# Patient Record
Sex: Female | Born: 1975 | Race: Black or African American | Hispanic: No | Marital: Single | Smoking: Former smoker
Health system: Southern US, Community
[De-identification: ages and names within clinical notes are randomized; demographics above are authoritative.]

## PROBLEM LIST (undated history)

## (undated) DIAGNOSIS — G2581 Restless legs syndrome: Secondary | ICD-10-CM

## (undated) DIAGNOSIS — E042 Nontoxic multinodular goiter: Secondary | ICD-10-CM

## (undated) DIAGNOSIS — G35 Multiple sclerosis: Secondary | ICD-10-CM

## (undated) DIAGNOSIS — H409 Unspecified glaucoma: Secondary | ICD-10-CM

## (undated) DIAGNOSIS — G932 Benign intracranial hypertension: Secondary | ICD-10-CM

## (undated) HISTORY — DX: Benign intracranial hypertension: G93.2

## (undated) HISTORY — DX: Restless legs syndrome: G25.81

---

## 2017-11-21 DIAGNOSIS — A609 Anogenital herpesviral infection, unspecified: Secondary | ICD-10-CM | POA: Diagnosis not present

## 2017-11-21 DIAGNOSIS — R300 Dysuria: Secondary | ICD-10-CM | POA: Diagnosis not present

## 2017-12-09 DIAGNOSIS — L219 Seborrheic dermatitis, unspecified: Secondary | ICD-10-CM | POA: Diagnosis not present

## 2017-12-09 DIAGNOSIS — L700 Acne vulgaris: Secondary | ICD-10-CM | POA: Diagnosis not present

## 2018-01-05 DIAGNOSIS — Z720 Tobacco use: Secondary | ICD-10-CM | POA: Insufficient documentation

## 2018-01-05 DIAGNOSIS — H526 Other disorders of refraction: Secondary | ICD-10-CM | POA: Diagnosis not present

## 2018-01-05 DIAGNOSIS — M21629 Bunionette of unspecified foot: Secondary | ICD-10-CM | POA: Insufficient documentation

## 2018-01-05 DIAGNOSIS — Z8669 Personal history of other diseases of the nervous system and sense organs: Secondary | ICD-10-CM | POA: Diagnosis not present

## 2018-01-05 DIAGNOSIS — M79673 Pain in unspecified foot: Secondary | ICD-10-CM | POA: Insufficient documentation

## 2018-01-05 DIAGNOSIS — G245 Blepharospasm: Secondary | ICD-10-CM | POA: Diagnosis not present

## 2018-07-15 DIAGNOSIS — Z3009 Encounter for other general counseling and advice on contraception: Secondary | ICD-10-CM | POA: Diagnosis not present

## 2018-07-15 DIAGNOSIS — A609 Anogenital herpesviral infection, unspecified: Secondary | ICD-10-CM | POA: Diagnosis not present

## 2018-07-15 DIAGNOSIS — N760 Acute vaginitis: Secondary | ICD-10-CM | POA: Diagnosis not present

## 2018-12-09 DIAGNOSIS — Z3202 Encounter for pregnancy test, result negative: Secondary | ICD-10-CM | POA: Diagnosis not present

## 2018-12-09 DIAGNOSIS — N959 Unspecified menopausal and perimenopausal disorder: Secondary | ICD-10-CM | POA: Diagnosis not present

## 2018-12-09 DIAGNOSIS — R202 Paresthesia of skin: Secondary | ICD-10-CM | POA: Diagnosis not present

## 2018-12-09 DIAGNOSIS — G588 Other specified mononeuropathies: Secondary | ICD-10-CM | POA: Diagnosis not present

## 2018-12-09 DIAGNOSIS — R5383 Other fatigue: Secondary | ICD-10-CM | POA: Diagnosis not present

## 2018-12-09 DIAGNOSIS — Z3009 Encounter for other general counseling and advice on contraception: Secondary | ICD-10-CM | POA: Diagnosis not present

## 2018-12-28 DIAGNOSIS — E559 Vitamin D deficiency, unspecified: Secondary | ICD-10-CM | POA: Diagnosis not present

## 2018-12-28 DIAGNOSIS — N3944 Nocturnal enuresis: Secondary | ICD-10-CM | POA: Diagnosis not present

## 2018-12-28 DIAGNOSIS — G588 Other specified mononeuropathies: Secondary | ICD-10-CM | POA: Diagnosis not present

## 2019-01-12 DIAGNOSIS — M47817 Spondylosis without myelopathy or radiculopathy, lumbosacral region: Secondary | ICD-10-CM | POA: Diagnosis not present

## 2019-01-12 DIAGNOSIS — M47816 Spondylosis without myelopathy or radiculopathy, lumbar region: Secondary | ICD-10-CM | POA: Diagnosis not present

## 2019-01-12 DIAGNOSIS — M5410 Radiculopathy, site unspecified: Secondary | ICD-10-CM | POA: Diagnosis not present

## 2019-01-12 DIAGNOSIS — G588 Other specified mononeuropathies: Secondary | ICD-10-CM | POA: Diagnosis not present

## 2019-01-15 DIAGNOSIS — M4696 Unspecified inflammatory spondylopathy, lumbar region: Secondary | ICD-10-CM | POA: Diagnosis not present

## 2019-01-15 DIAGNOSIS — L219 Seborrheic dermatitis, unspecified: Secondary | ICD-10-CM | POA: Diagnosis not present

## 2019-01-15 DIAGNOSIS — Z6841 Body Mass Index (BMI) 40.0 and over, adult: Secondary | ICD-10-CM | POA: Diagnosis not present

## 2019-01-29 DIAGNOSIS — Z9189 Other specified personal risk factors, not elsewhere classified: Secondary | ICD-10-CM | POA: Diagnosis not present

## 2019-04-29 DIAGNOSIS — G459 Transient cerebral ischemic attack, unspecified: Secondary | ICD-10-CM | POA: Diagnosis not present

## 2019-04-29 DIAGNOSIS — R51 Headache: Secondary | ICD-10-CM | POA: Diagnosis not present

## 2019-04-29 DIAGNOSIS — H538 Other visual disturbances: Secondary | ICD-10-CM | POA: Diagnosis not present

## 2019-04-29 DIAGNOSIS — R200 Anesthesia of skin: Secondary | ICD-10-CM | POA: Diagnosis not present

## 2019-09-14 DIAGNOSIS — N3942 Incontinence without sensory awareness: Secondary | ICD-10-CM | POA: Diagnosis not present

## 2019-09-14 DIAGNOSIS — H539 Unspecified visual disturbance: Secondary | ICD-10-CM | POA: Diagnosis not present

## 2019-09-14 DIAGNOSIS — M6281 Muscle weakness (generalized): Secondary | ICD-10-CM | POA: Diagnosis not present

## 2019-09-14 DIAGNOSIS — R202 Paresthesia of skin: Secondary | ICD-10-CM | POA: Diagnosis not present

## 2019-09-14 DIAGNOSIS — R29898 Other symptoms and signs involving the musculoskeletal system: Secondary | ICD-10-CM | POA: Diagnosis not present

## 2019-09-30 DIAGNOSIS — N760 Acute vaginitis: Secondary | ICD-10-CM | POA: Diagnosis not present

## 2019-10-13 DIAGNOSIS — M7989 Other specified soft tissue disorders: Secondary | ICD-10-CM | POA: Diagnosis not present

## 2019-10-13 DIAGNOSIS — I82492 Acute embolism and thrombosis of other specified deep vein of left lower extremity: Secondary | ICD-10-CM | POA: Diagnosis not present

## 2019-10-18 DIAGNOSIS — I82462 Acute embolism and thrombosis of left calf muscular vein: Secondary | ICD-10-CM | POA: Diagnosis not present

## 2019-10-18 DIAGNOSIS — I82402 Acute embolism and thrombosis of unspecified deep veins of left lower extremity: Secondary | ICD-10-CM | POA: Diagnosis not present

## 2019-10-18 DIAGNOSIS — R9431 Abnormal electrocardiogram [ECG] [EKG]: Secondary | ICD-10-CM | POA: Diagnosis not present

## 2019-10-18 DIAGNOSIS — M549 Dorsalgia, unspecified: Secondary | ICD-10-CM | POA: Diagnosis not present

## 2019-10-18 DIAGNOSIS — M79605 Pain in left leg: Secondary | ICD-10-CM | POA: Diagnosis not present

## 2019-10-18 DIAGNOSIS — G8929 Other chronic pain: Secondary | ICD-10-CM | POA: Diagnosis not present

## 2019-11-04 DIAGNOSIS — Z1159 Encounter for screening for other viral diseases: Secondary | ICD-10-CM | POA: Diagnosis not present

## 2019-11-04 DIAGNOSIS — Z20822 Contact with and (suspected) exposure to covid-19: Secondary | ICD-10-CM | POA: Diagnosis not present

## 2019-11-05 DIAGNOSIS — R29898 Other symptoms and signs involving the musculoskeletal system: Secondary | ICD-10-CM | POA: Diagnosis not present

## 2019-11-05 DIAGNOSIS — M6281 Muscle weakness (generalized): Secondary | ICD-10-CM | POA: Diagnosis not present

## 2019-11-05 DIAGNOSIS — N3942 Incontinence without sensory awareness: Secondary | ICD-10-CM | POA: Diagnosis not present

## 2019-11-05 DIAGNOSIS — G35 Multiple sclerosis: Secondary | ICD-10-CM | POA: Diagnosis not present

## 2019-11-05 DIAGNOSIS — R9089 Other abnormal findings on diagnostic imaging of central nervous system: Secondary | ICD-10-CM | POA: Diagnosis not present

## 2019-11-05 DIAGNOSIS — R202 Paresthesia of skin: Secondary | ICD-10-CM | POA: Diagnosis not present

## 2019-11-05 DIAGNOSIS — H539 Unspecified visual disturbance: Secondary | ICD-10-CM | POA: Diagnosis not present

## 2019-12-09 DIAGNOSIS — B373 Candidiasis of vulva and vagina: Secondary | ICD-10-CM | POA: Diagnosis not present

## 2019-12-09 DIAGNOSIS — N7689 Other specified inflammation of vagina and vulva: Secondary | ICD-10-CM | POA: Diagnosis not present

## 2020-01-10 DIAGNOSIS — M792 Neuralgia and neuritis, unspecified: Secondary | ICD-10-CM | POA: Diagnosis not present

## 2020-01-10 DIAGNOSIS — M21612 Bunion of left foot: Secondary | ICD-10-CM | POA: Diagnosis not present

## 2020-01-10 DIAGNOSIS — M79605 Pain in left leg: Secondary | ICD-10-CM | POA: Diagnosis not present

## 2020-01-10 DIAGNOSIS — M21622 Bunionette of left foot: Secondary | ICD-10-CM | POA: Diagnosis not present

## 2020-01-10 DIAGNOSIS — M25562 Pain in left knee: Secondary | ICD-10-CM | POA: Diagnosis not present

## 2020-01-12 DIAGNOSIS — M25562 Pain in left knee: Secondary | ICD-10-CM | POA: Diagnosis not present

## 2020-01-13 DIAGNOSIS — R9089 Other abnormal findings on diagnostic imaging of central nervous system: Secondary | ICD-10-CM | POA: Diagnosis not present

## 2020-01-13 DIAGNOSIS — H539 Unspecified visual disturbance: Secondary | ICD-10-CM | POA: Diagnosis not present

## 2020-01-13 DIAGNOSIS — N3942 Incontinence without sensory awareness: Secondary | ICD-10-CM | POA: Diagnosis not present

## 2020-01-13 DIAGNOSIS — R202 Paresthesia of skin: Secondary | ICD-10-CM | POA: Diagnosis not present

## 2020-01-13 DIAGNOSIS — M6281 Muscle weakness (generalized): Secondary | ICD-10-CM | POA: Diagnosis not present

## 2020-01-13 DIAGNOSIS — R29898 Other symptoms and signs involving the musculoskeletal system: Secondary | ICD-10-CM | POA: Diagnosis not present

## 2020-01-13 DIAGNOSIS — E519 Thiamine deficiency, unspecified: Secondary | ICD-10-CM | POA: Diagnosis not present

## 2020-01-17 DIAGNOSIS — M25562 Pain in left knee: Secondary | ICD-10-CM | POA: Diagnosis not present

## 2020-01-17 DIAGNOSIS — M21612 Bunion of left foot: Secondary | ICD-10-CM | POA: Diagnosis not present

## 2020-01-17 DIAGNOSIS — M79605 Pain in left leg: Secondary | ICD-10-CM | POA: Diagnosis not present

## 2020-01-17 DIAGNOSIS — M792 Neuralgia and neuritis, unspecified: Secondary | ICD-10-CM | POA: Diagnosis not present

## 2020-01-17 DIAGNOSIS — M21622 Bunionette of left foot: Secondary | ICD-10-CM | POA: Diagnosis not present

## 2020-01-27 DIAGNOSIS — H526 Other disorders of refraction: Secondary | ICD-10-CM | POA: Diagnosis not present

## 2020-01-27 DIAGNOSIS — H04223 Epiphora due to insufficient drainage, bilateral lacrimal glands: Secondary | ICD-10-CM | POA: Diagnosis not present

## 2020-01-27 DIAGNOSIS — H5310 Unspecified subjective visual disturbances: Secondary | ICD-10-CM | POA: Diagnosis not present

## 2020-02-15 DIAGNOSIS — I82409 Acute embolism and thrombosis of unspecified deep veins of unspecified lower extremity: Secondary | ICD-10-CM | POA: Insufficient documentation

## 2020-02-15 DIAGNOSIS — E519 Thiamine deficiency, unspecified: Secondary | ICD-10-CM | POA: Insufficient documentation

## 2020-02-15 DIAGNOSIS — Z8669 Personal history of other diseases of the nervous system and sense organs: Secondary | ICD-10-CM | POA: Insufficient documentation

## 2020-02-25 DIAGNOSIS — R202 Paresthesia of skin: Secondary | ICD-10-CM | POA: Diagnosis not present

## 2020-02-25 DIAGNOSIS — R251 Tremor, unspecified: Secondary | ICD-10-CM | POA: Diagnosis not present

## 2020-02-25 DIAGNOSIS — M6281 Muscle weakness (generalized): Secondary | ICD-10-CM | POA: Diagnosis not present

## 2020-02-25 DIAGNOSIS — R29898 Other symptoms and signs involving the musculoskeletal system: Secondary | ICD-10-CM | POA: Diagnosis not present

## 2020-02-25 DIAGNOSIS — R9089 Other abnormal findings on diagnostic imaging of central nervous system: Secondary | ICD-10-CM | POA: Diagnosis not present

## 2020-02-25 DIAGNOSIS — H539 Unspecified visual disturbance: Secondary | ICD-10-CM | POA: Diagnosis not present

## 2020-02-25 DIAGNOSIS — R519 Headache, unspecified: Secondary | ICD-10-CM | POA: Diagnosis not present

## 2020-02-25 DIAGNOSIS — H519 Unspecified disorder of binocular movement: Secondary | ICD-10-CM | POA: Diagnosis not present

## 2020-02-29 DIAGNOSIS — R0600 Dyspnea, unspecified: Secondary | ICD-10-CM | POA: Diagnosis not present

## 2020-02-29 DIAGNOSIS — R002 Palpitations: Secondary | ICD-10-CM | POA: Diagnosis not present

## 2020-03-03 DIAGNOSIS — G35 Multiple sclerosis: Secondary | ICD-10-CM | POA: Diagnosis not present

## 2020-03-03 DIAGNOSIS — G939 Disorder of brain, unspecified: Secondary | ICD-10-CM | POA: Diagnosis not present

## 2020-03-03 DIAGNOSIS — R9089 Other abnormal findings on diagnostic imaging of central nervous system: Secondary | ICD-10-CM | POA: Diagnosis not present

## 2020-03-21 DIAGNOSIS — Z87891 Personal history of nicotine dependence: Secondary | ICD-10-CM | POA: Diagnosis not present

## 2020-03-21 DIAGNOSIS — M79605 Pain in left leg: Secondary | ICD-10-CM | POA: Diagnosis not present

## 2020-04-18 DIAGNOSIS — Z1231 Encounter for screening mammogram for malignant neoplasm of breast: Secondary | ICD-10-CM | POA: Diagnosis not present

## 2020-04-18 DIAGNOSIS — M79605 Pain in left leg: Secondary | ICD-10-CM | POA: Diagnosis not present

## 2020-04-18 DIAGNOSIS — N6489 Other specified disorders of breast: Secondary | ICD-10-CM | POA: Diagnosis not present

## 2020-04-24 DIAGNOSIS — H538 Other visual disturbances: Secondary | ICD-10-CM | POA: Diagnosis not present

## 2020-04-24 DIAGNOSIS — H40052 Ocular hypertension, left eye: Secondary | ICD-10-CM | POA: Diagnosis not present

## 2020-04-24 DIAGNOSIS — G501 Atypical facial pain: Secondary | ICD-10-CM | POA: Diagnosis not present

## 2020-04-24 DIAGNOSIS — H5712 Ocular pain, left eye: Secondary | ICD-10-CM | POA: Diagnosis not present

## 2020-05-09 DIAGNOSIS — M79605 Pain in left leg: Secondary | ICD-10-CM | POA: Diagnosis not present

## 2020-05-09 DIAGNOSIS — N6489 Other specified disorders of breast: Secondary | ICD-10-CM | POA: Diagnosis not present

## 2020-05-09 DIAGNOSIS — Z1231 Encounter for screening mammogram for malignant neoplasm of breast: Secondary | ICD-10-CM | POA: Diagnosis not present

## 2020-05-09 DIAGNOSIS — R928 Other abnormal and inconclusive findings on diagnostic imaging of breast: Secondary | ICD-10-CM | POA: Diagnosis not present

## 2020-05-17 DIAGNOSIS — R899 Unspecified abnormal finding in specimens from other organs, systems and tissues: Secondary | ICD-10-CM | POA: Diagnosis not present

## 2020-05-17 DIAGNOSIS — M6281 Muscle weakness (generalized): Secondary | ICD-10-CM | POA: Diagnosis not present

## 2020-05-24 DIAGNOSIS — H40023 Open angle with borderline findings, high risk, bilateral: Secondary | ICD-10-CM | POA: Diagnosis not present

## 2020-05-26 DIAGNOSIS — R899 Unspecified abnormal finding in specimens from other organs, systems and tissues: Secondary | ICD-10-CM | POA: Diagnosis not present

## 2020-05-26 DIAGNOSIS — E519 Thiamine deficiency, unspecified: Secondary | ICD-10-CM | POA: Diagnosis not present

## 2020-05-26 DIAGNOSIS — R790 Abnormal level of blood mineral: Secondary | ICD-10-CM | POA: Diagnosis not present

## 2020-05-26 DIAGNOSIS — E611 Iron deficiency: Secondary | ICD-10-CM | POA: Diagnosis not present

## 2020-05-26 DIAGNOSIS — R9089 Other abnormal findings on diagnostic imaging of central nervous system: Secondary | ICD-10-CM | POA: Diagnosis not present

## 2020-05-26 DIAGNOSIS — R978 Other abnormal tumor markers: Secondary | ICD-10-CM | POA: Diagnosis not present

## 2020-05-26 DIAGNOSIS — C801 Malignant (primary) neoplasm, unspecified: Secondary | ICD-10-CM | POA: Diagnosis not present

## 2020-05-31 DIAGNOSIS — N7689 Other specified inflammation of vagina and vulva: Secondary | ICD-10-CM | POA: Diagnosis not present

## 2020-05-31 DIAGNOSIS — L0292 Furuncle, unspecified: Secondary | ICD-10-CM | POA: Diagnosis not present

## 2020-06-02 DIAGNOSIS — C801 Malignant (primary) neoplasm, unspecified: Secondary | ICD-10-CM | POA: Diagnosis not present

## 2020-06-02 DIAGNOSIS — R899 Unspecified abnormal finding in specimens from other organs, systems and tissues: Secondary | ICD-10-CM | POA: Diagnosis not present

## 2020-06-02 DIAGNOSIS — R978 Other abnormal tumor markers: Secondary | ICD-10-CM | POA: Diagnosis not present

## 2020-06-14 DIAGNOSIS — R87610 Atypical squamous cells of undetermined significance on cytologic smear of cervix (ASC-US): Secondary | ICD-10-CM | POA: Diagnosis not present

## 2020-06-14 DIAGNOSIS — R5383 Other fatigue: Secondary | ICD-10-CM | POA: Diagnosis not present

## 2020-06-14 DIAGNOSIS — Z87891 Personal history of nicotine dependence: Secondary | ICD-10-CM | POA: Diagnosis not present

## 2020-06-14 DIAGNOSIS — Z6841 Body Mass Index (BMI) 40.0 and over, adult: Secondary | ICD-10-CM | POA: Diagnosis not present

## 2020-06-14 DIAGNOSIS — Z01419 Encounter for gynecological examination (general) (routine) without abnormal findings: Secondary | ICD-10-CM | POA: Diagnosis not present

## 2020-06-14 DIAGNOSIS — Z114 Encounter for screening for human immunodeficiency virus [HIV]: Secondary | ICD-10-CM | POA: Diagnosis not present

## 2020-06-14 DIAGNOSIS — L309 Dermatitis, unspecified: Secondary | ICD-10-CM | POA: Diagnosis not present

## 2020-06-14 DIAGNOSIS — L219 Seborrheic dermatitis, unspecified: Secondary | ICD-10-CM | POA: Diagnosis not present

## 2020-06-14 DIAGNOSIS — Z113 Encounter for screening for infections with a predominantly sexual mode of transmission: Secondary | ICD-10-CM | POA: Diagnosis not present

## 2020-06-14 DIAGNOSIS — G35 Multiple sclerosis: Secondary | ICD-10-CM | POA: Diagnosis not present

## 2020-06-14 DIAGNOSIS — Z0000 Encounter for general adult medical examination without abnormal findings: Secondary | ICD-10-CM | POA: Diagnosis not present

## 2020-08-30 DIAGNOSIS — R319 Hematuria, unspecified: Secondary | ICD-10-CM | POA: Diagnosis not present

## 2020-08-30 DIAGNOSIS — N390 Urinary tract infection, site not specified: Secondary | ICD-10-CM | POA: Diagnosis not present

## 2020-08-30 DIAGNOSIS — Z1159 Encounter for screening for other viral diseases: Secondary | ICD-10-CM | POA: Diagnosis not present

## 2020-08-30 DIAGNOSIS — G35 Multiple sclerosis: Secondary | ICD-10-CM | POA: Diagnosis not present

## 2020-08-30 DIAGNOSIS — Z113 Encounter for screening for infections with a predominantly sexual mode of transmission: Secondary | ICD-10-CM | POA: Diagnosis not present

## 2020-09-05 DIAGNOSIS — E611 Iron deficiency: Secondary | ICD-10-CM | POA: Insufficient documentation

## 2020-09-05 DIAGNOSIS — M6281 Muscle weakness (generalized): Secondary | ICD-10-CM | POA: Diagnosis not present

## 2020-09-05 DIAGNOSIS — R202 Paresthesia of skin: Secondary | ICD-10-CM | POA: Diagnosis not present

## 2020-09-05 DIAGNOSIS — R9089 Other abnormal findings on diagnostic imaging of central nervous system: Secondary | ICD-10-CM | POA: Diagnosis not present

## 2020-09-05 DIAGNOSIS — R519 Headache, unspecified: Secondary | ICD-10-CM | POA: Diagnosis not present

## 2020-09-15 DIAGNOSIS — G35 Multiple sclerosis: Secondary | ICD-10-CM | POA: Diagnosis not present

## 2020-09-15 DIAGNOSIS — M6281 Muscle weakness (generalized): Secondary | ICD-10-CM | POA: Diagnosis not present

## 2020-09-15 DIAGNOSIS — R87810 Cervical high risk human papillomavirus (HPV) DNA test positive: Secondary | ICD-10-CM | POA: Diagnosis not present

## 2020-09-15 DIAGNOSIS — R87610 Atypical squamous cells of undetermined significance on cytologic smear of cervix (ASC-US): Secondary | ICD-10-CM | POA: Diagnosis not present

## 2020-09-20 DIAGNOSIS — Z20822 Contact with and (suspected) exposure to covid-19: Secondary | ICD-10-CM | POA: Diagnosis not present

## 2020-10-19 DIAGNOSIS — G35 Multiple sclerosis: Secondary | ICD-10-CM | POA: Diagnosis not present

## 2020-10-19 DIAGNOSIS — H539 Unspecified visual disturbance: Secondary | ICD-10-CM | POA: Diagnosis not present

## 2020-10-19 DIAGNOSIS — R519 Headache, unspecified: Secondary | ICD-10-CM | POA: Diagnosis not present

## 2020-10-19 DIAGNOSIS — R9089 Other abnormal findings on diagnostic imaging of central nervous system: Secondary | ICD-10-CM | POA: Diagnosis not present

## 2020-11-28 ENCOUNTER — Ambulatory Visit (HOSPITAL_COMMUNITY)
Admission: EM | Admit: 2020-11-28 | Discharge: 2020-11-28 | Disposition: A | Discharge status: Home / Self Care | Payer: Medicaid Other | Attending: Urgent Care | Admitting: Urgent Care

## 2020-11-28 ENCOUNTER — Other Ambulatory Visit: Payer: Self-pay

## 2020-11-28 ENCOUNTER — Encounter (HOSPITAL_COMMUNITY): Payer: Self-pay | Admitting: Emergency Medicine

## 2020-11-28 ENCOUNTER — Ambulatory Visit (INDEPENDENT_AMBULATORY_CARE_PROVIDER_SITE_OTHER): Payer: Medicaid Other

## 2020-11-28 ENCOUNTER — Encounter (HOSPITAL_COMMUNITY): Payer: Self-pay | Admitting: *Deleted

## 2020-11-28 ENCOUNTER — Ambulatory Visit (HOSPITAL_COMMUNITY)
Admission: EM | Admit: 2020-11-28 | Discharge: 2020-11-28 | Disposition: A | Discharge status: Home / Self Care | Payer: Medicaid Other | Attending: Family Medicine | Admitting: Family Medicine

## 2020-11-28 ENCOUNTER — Ambulatory Visit (HOSPITAL_COMMUNITY): Payer: Medicaid Other

## 2020-11-28 DIAGNOSIS — M549 Dorsalgia, unspecified: Secondary | ICD-10-CM

## 2020-11-28 DIAGNOSIS — R109 Unspecified abdominal pain: Secondary | ICD-10-CM

## 2020-11-28 DIAGNOSIS — M545 Low back pain, unspecified: Secondary | ICD-10-CM

## 2020-11-28 HISTORY — DX: Multiple sclerosis: G35

## 2020-11-28 LAB — POCT URINALYSIS DIPSTICK, ED / UC
Bilirubin Urine: NEGATIVE
Glucose, UA: NEGATIVE mg/dL
Hgb urine dipstick: NEGATIVE
Ketones, ur: NEGATIVE mg/dL
Leukocytes,Ua: NEGATIVE
Nitrite: NEGATIVE
Protein, ur: NEGATIVE mg/dL
Specific Gravity, Urine: 1.025 (ref 1.005–1.030)
Urobilinogen, UA: 0.2 mg/dL (ref 0.0–1.0)
pH: 6 (ref 5.0–8.0)

## 2020-11-28 NOTE — ED Notes (Addendum)
Pt states unable to wait for xray due to MS flare up. States too uncomfortable due to pain to sit in exam room. Provider advised

## 2020-11-28 NOTE — ED Provider Notes (Signed)
Darlene Bowen   [id number] 11/28/20 Arrival Time: 1446  CC: ABDOMINAL PAIN  SUBJECTIVE:  Darlene Bowen is a 45 y.o. female who presents with intermittent moderate left low back pain that radiates to L flank. Reports that she is concerned that this may be GI related versus an "MS hug." Denies a precipitating event, trauma, close contacts with similar symptoms, recent travel or antibiotic use. Denies abdominal cramping. Has not taken OTC medications for this. Denies alleviating or aggravating factors. Denies similar symptoms in the past. Last BM about 3 days ago.    Denies fever, chills, appetite changes, weight changes, nausea, vomiting, chest pain, SOB, diarrhea, constipation, hematochezia, melena, dysuria, difficulty urinating, increased frequency or urgency, flank pain, loss of bowel or bladder function, vaginal discharge, vaginal odor, vaginal bleeding, dyspareunia, pelvic pain.   Patient's last menstrual period was 10/31/2020.  ROS: As per HPI.  All other pertinent ROS negative.     Past Medical History:  Diagnosis Date  . Multiple sclerosis (HCC)    History reviewed. No pertinent surgical history. Allergies  Allergen Reactions  . Codeine Anaphylaxis  . Macrobid [Nitrofurantoin] Anaphylaxis  . Penicillins    No current facility-administered medications on file prior to encounter.   Current Outpatient Medications on File Prior to Encounter  Medication Sig Dispense Refill  . acetaZOLAMIDE (DIAMOX) 250 MG tablet Take 250 mg by mouth 2 (two) times daily.    . pregabalin (LYRICA) 50 MG capsule Take 50 mg by mouth. Take 50mg  daily in the morning and 100mg  at lunch and at bedtime     Social History   Socioeconomic History  . Marital status: Single    Spouse name: Not on file  . Number of children: Not on file  . Years of education: Not on file  . Highest education level: Not on file  Occupational History  . Not on file  Tobacco Use  . Smoking status: Never Smoker   . Smokeless tobacco: Never Used  Substance and Sexual Activity  . Alcohol use: Not on file  . Drug use: Not on file  . Sexual activity: Not on file  Other Topics Concern  . Not on file  Social History Narrative  . Not on file   Social Determinants of Health   Financial Resource Strain: Not on file  Food Insecurity: Not on file  Transportation Needs: Not on file  Physical Activity: Not on file  Stress: Not on file  Social Connections: Not on file  Intimate Partner Violence: Not on file   History reviewed. No pertinent family history.   OBJECTIVE:  Vitals:   11/28/20 1532  BP: (!) 143/88  Pulse: 69  Resp: 16  Temp: 97.7 F (36.5 C)  TempSrc: Oral  SpO2: 100%    General appearance: Alert; NAD HEENT: NCAT.  Oropharynx clear.  Lungs: clear to auscultation bilaterally without adventitious breath sounds Heart: regular rate and rhythm.  Radial pulses 2+ symmetrical bilaterally Abdomen: soft, non-distended; normal active bowel sounds; Left middle abdomen mildly tender to deep palpation; nontender at McBurney's point; negative Murphy's sign; negative rebound; no guarding Back: no CVA tenderness Extremities: no edema; symmetrical with no gross deformities Skin: warm and dry Neurologic: normal gait Psychological: alert and cooperative; normal mood and affect  LABS: Results for orders placed or performed during the hospital encounter of 11/28/20 (from the past 24 hour(s))  POC Urinalysis dipstick     Status: None   Collection Time: 11/28/20  1:08 PM  Result Value Ref Range  Glucose, UA NEGATIVE NEGATIVE mg/dL   Bilirubin Urine NEGATIVE NEGATIVE   Ketones, ur NEGATIVE NEGATIVE mg/dL   Specific Gravity, Urine 1.025 1.005 - 1.030   Hgb urine dipstick NEGATIVE NEGATIVE   pH 6.0 5.0 - 8.0   Protein, ur NEGATIVE NEGATIVE mg/dL   Urobilinogen, UA 0.2 0.0 - 1.0 mg/dL   Nitrite NEGATIVE NEGATIVE   Leukocytes,Ua NEGATIVE NEGATIVE    DIAGNOSTIC STUDIES: DG Abd 1  View  Result Date: 11/28/2020 CLINICAL DATA:  Left lower pelvic pain wrapping around back for 2 months, increasing EXAM: ABDOMEN - 1 VIEW COMPARISON:  None. FINDINGS: Few punctate radiodensities in the pelvis have an appearance most suggestive of small phleboliths. No other suspicious abdominal calcifications over the course of the renal shadows or urinary tract. No high-grade obstructive bowel gas pattern. No other acute osseous or soft tissue abnormality. IMPRESSION: No acute radiographic abnormality of the included abdomen and pelvis. Punctate calcifications in the pelvis have an appearance most suggestive of phleboliths. Electronically Signed   By: Kreg Shropshire M.D.   On: 11/28/2020 16:08     ASSESSMENT & PLAN:  1. Acute left-sided back pain, unspecified back location   2. Left lateral abdominal pain     No orders of the defined types were placed in this encounter.  Xray today shows no obvious cause for pain Would recommend softening stool and laxative to help ease GI load Clear liquids only for 24 hours (water, tea, sport drinks, clear flat ginger ale or cola and juices, broth, jello, popsicles, ect). Advance to bland foods, applesauce, rice, baked or boiled chicken, ect. Avoid milk, greasy foods and anything that doesn't agree with you.  If you experience new or worsening symptoms return or go to ER such as fever, chills, nausea, vomiting, diarrhea, bloody or dark tarry stools, constipation, urinary symptoms, worsening abdominal discomfort, symptoms that do not improve with medications, inability to keep fluids down.  Reviewed expectations re: course of current medical issues. Questions answered. Outlined signs and symptoms indicating need for more acute intervention. Patient verbalized understanding. After Visit Summary given.   Moshe Cipro, NP 11/28/20 [phone]

## 2020-11-28 NOTE — ED Provider Notes (Signed)
[facility]   MRN: [id number] DOB: 1976/02/25  Subjective:   Darlene Bowen is a 45 y.o. female presenting for 1 to 9-month history of persistent intermittent moderate left low back pain that radiates anteriorly along her flank side.  Symptoms can be elicited by lifting her arm but also occur randomly.  In the past year she has been diagnosed with MS at [facility] system.  She was being managed there and recently had a lumbar puncture in February.  Reports that she moved here to [facility] in the past week.  Does not have anyone that she is establish care with.  She does have all of her medications and does not need refills at the moment.  Denies fever, cough, chest pain, shortness of breath, nausea, vomiting, diarrhea, bloody stools.  Patient states she has had a bit more constipation this past year.  She has also been taking iron supplements.  Reports that she does have bowel movements on a daily basis but is more like sludge.    Takes acetazolamide, iron supplement, Lyrica.   Allergies  Allergen Reactions  . Codeine Anaphylaxis  . Macrobid [Nitrofurantoin] Anaphylaxis  . Penicillins     PMH . Anemia 2010  . Bell's palsy  . Bell's palsy  . Eye disease  . Migraine headache  . Multiple sclerosis (CMS-HCC)  . Tremor    Past Surgical History:  Procedure Laterality Date  . foot surgery  . perirectal abcess 2006    Family History  Problem Relation Age of Onset  . Diabetes Mother  . High blood pressure (Hypertension) Mother  . Diabetes type II Mother  . Hyperlipidemia (Elevated cholesterol) Father  . High blood pressure (Hypertension) Father  . Glaucoma Father  . Clotting disorder Father  . Deep vein thrombosis (DVT or abnormal blood clot formation) Father  . Cancer Maternal Uncle  . Cancer Maternal Grandmother  . Glaucoma Paternal Aunt  . Glaucoma Paternal Grandmother  . Anesthesia problems Neg Hx  . Malignant hypertension Neg Hx  .  Malignant hyperthermia Neg Hx  . Pseudochol deficiency Neg Hx  . PONV Neg Hx  . Breast cancer Neg Hx  . Macular degeneration Neg Hx    Social History  Socioeconomic History  . Marital status: Single  Spouse name: Not on file  . Number of children: Not on file  . Years of education: Not on file  . Highest education level: Not on file  Occupational History  . Not on file  Tobacco Use  . Smoking status: Former Smoker  Packs/day: 0.00  Years: 20.00  Pack years: 0.00  Types: Cigarettes  Quit date: 10/18/2019  Years since quitting: 1.0  . Smokeless tobacco: Never Used  Vaping Use  . Vaping Use: Never used  Substance and Sexual Activity  . Alcohol use: Not Currently  . Drug use: Yes  Frequency: 12.0 times per week  Types: Marijuana  Comment: marijuana every day  . Sexual activity: Not Currently  Partners: Male  Other Topics Concern  . Not on file  Social History Narrative  Patient works as a Furniture conservator/restorer. Retired Charity fundraiser. She lives with her son.   Social Determinants of Health   Financial Resource Strain: Not on file  Food Insecurity: Not on file  Transportation Needs: Not on file    Allergies  Allergen Reactions  . Codeine Anaphylaxis  . Penicillin Anaphylaxis  . Macrobid [Nitrofurantoin Monohyd/M-Cryst] Hives  Patient denies chest pain or shortness of breath with exertion  ROS   Objective:   Vitals: BP 106/77 (BP Location: Right Arm)   Pulse 70   Temp 97.8 F (36.6 C) (Oral)   Resp 18   LMP 10/31/2020   SpO2 100%   Physical Exam Constitutional:      General: She is not in acute distress.    Appearance: Normal appearance. She is well-developed and normal weight. She is not ill-appearing, toxic-appearing or diaphoretic.  HENT:     Head: Normocephalic and atraumatic.     Right Ear: External ear normal.     Left Ear: External ear normal.     Nose: Nose normal.     Mouth/Throat:     Mouth: Mucous membranes are moist.     Pharynx: Oropharynx  is clear.  Eyes:     General: No scleral icterus.    Extraocular Movements: Extraocular movements intact.     Pupils: Pupils are equal, round, and reactive to light.  Cardiovascular:     Rate and Rhythm: Normal rate and regular rhythm.     Heart sounds: Normal heart sounds. No murmur heard. No friction rub. No gallop.   Pulmonary:     Effort: Pulmonary effort is normal. No respiratory distress.     Breath sounds: Normal breath sounds. No stridor. No wheezing, rhonchi or rales.  Abdominal:     General: Bowel sounds are normal. There is no distension.     Palpations: Abdomen is soft. There is no mass.     Tenderness: There is abdominal tenderness. There is no right CVA tenderness, left CVA tenderness, guarding or rebound.    Musculoskeletal:     Lumbar back: Spasms and tenderness present. No swelling, edema, deformity, signs of trauma, lacerations or bony tenderness. Normal range of motion. Negative right straight leg raise test and negative left straight leg raise test. No scoliosis.       Back:     Comments: Tenderness along the areas outlined starting over the left low back which is where it is worse and extending along the flank side as depicted on the abdominal image.  Skin:    General: Skin is warm and dry.     Coloration: Skin is not pale.     Findings: No rash.  Neurological:     General: No focal deficit present.     Mental Status: She is alert and oriented to person, place, and time.     Motor: No weakness.     Coordination: Coordination normal.     Gait: Gait normal.     Deep Tendon Reflexes: Reflexes normal.  Psychiatric:        Mood and Affect: Mood normal.        Behavior: Behavior normal.        Thought Content: Thought content normal.        Judgment: Judgment normal.     Results for orders placed or performed during the hospital encounter of 11/28/20 (from the past 24 hour(s))  POC Urinalysis dipstick     Status: None   Collection Time: 11/28/20  1:08 PM   Result Value Ref Range   Glucose, UA NEGATIVE NEGATIVE mg/dL   Bilirubin Urine NEGATIVE NEGATIVE   Ketones, ur NEGATIVE NEGATIVE mg/dL   Specific Gravity, Urine 1.025 1.005 - 1.030   Hgb urine dipstick NEGATIVE NEGATIVE   pH 6.0 5.0 - 8.0   Protein, ur NEGATIVE NEGATIVE mg/dL   Urobilinogen, UA 0.2 0.0 - 1.0 mg/dL   Nitrite NEGATIVE NEGATIVE   Leukocytes,Ua NEGATIVE  NEGATIVE    Assessment and Plan :   PDMP not reviewed this encounter.  1. Left flank pain   2. Acute left-sided low back pain without sciatica     I placed an order for an abdominal x-ray but unfortunately patient had to leave the clinic.  I was with another patient and therefore could not finish out my visit with patient.  No prescriptions were sent to a pharmacy for her symptoms as such.   Wallis Bamberg, PA-C 11/28/20 1352

## 2020-11-28 NOTE — Discharge Instructions (Signed)
Your xray shows a large amount of stool in your colon  I would have you use Miralax to help get this moving  Drink plenty of water, increase fiber in your diet  Your medications can also cause constipation  Follow up with this office or with primary care if symptoms are persisting.  Follow up in the ER for high fever, trouble swallowing, trouble breathing, other concerning symptoms.

## 2020-11-28 NOTE — ED Triage Notes (Signed)
Pt was at UC earlier today but had to leave due to being behind on medications.

## 2020-11-28 NOTE — ED Triage Notes (Signed)
Pt reports Lt lower back and pain increase when Pt raises arm above head.

## 2020-11-30 DIAGNOSIS — A6000 Herpesviral infection of urogenital system, unspecified: Secondary | ICD-10-CM | POA: Diagnosis not present

## 2020-11-30 DIAGNOSIS — Z79899 Other long term (current) drug therapy: Secondary | ICD-10-CM | POA: Diagnosis not present

## 2020-12-09 ENCOUNTER — Emergency Department (HOSPITAL_COMMUNITY)
Admission: EM | Admit: 2020-12-09 | Discharge: 2020-12-09 | Disposition: A | Discharge status: Home / Self Care | Payer: Medicaid Other | Attending: Emergency Medicine | Admitting: Emergency Medicine

## 2020-12-09 ENCOUNTER — Emergency Department (HOSPITAL_COMMUNITY): Payer: Medicaid Other

## 2020-12-09 ENCOUNTER — Encounter (HOSPITAL_COMMUNITY): Payer: Self-pay

## 2020-12-09 ENCOUNTER — Ambulatory Visit (HOSPITAL_COMMUNITY)
Admission: EM | Admit: 2020-12-09 | Discharge: 2020-12-09 | Disposition: A | Discharge status: Home / Self Care | Payer: Medicaid Other | Attending: Emergency Medicine | Admitting: Emergency Medicine

## 2020-12-09 ENCOUNTER — Other Ambulatory Visit: Payer: Self-pay

## 2020-12-09 ENCOUNTER — Encounter (HOSPITAL_COMMUNITY): Payer: Self-pay | Admitting: Emergency Medicine

## 2020-12-09 DIAGNOSIS — Z3202 Encounter for pregnancy test, result negative: Secondary | ICD-10-CM

## 2020-12-09 DIAGNOSIS — R109 Unspecified abdominal pain: Secondary | ICD-10-CM

## 2020-12-09 DIAGNOSIS — R1032 Left lower quadrant pain: Secondary | ICD-10-CM | POA: Diagnosis not present

## 2020-12-09 DIAGNOSIS — R112 Nausea with vomiting, unspecified: Secondary | ICD-10-CM | POA: Diagnosis not present

## 2020-12-09 DIAGNOSIS — K760 Fatty (change of) liver, not elsewhere classified: Secondary | ICD-10-CM | POA: Diagnosis not present

## 2020-12-09 DIAGNOSIS — M545 Low back pain, unspecified: Secondary | ICD-10-CM | POA: Diagnosis not present

## 2020-12-09 LAB — POCT URINALYSIS DIPSTICK, ED / UC
Bilirubin Urine: NEGATIVE
Glucose, UA: NEGATIVE mg/dL
Hgb urine dipstick: NEGATIVE
Ketones, ur: NEGATIVE mg/dL
Leukocytes,Ua: NEGATIVE
Nitrite: NEGATIVE
Protein, ur: NEGATIVE mg/dL
Specific Gravity, Urine: 1.02 (ref 1.005–1.030)
Urobilinogen, UA: 0.2 mg/dL (ref 0.0–1.0)
pH: 5.5 (ref 5.0–8.0)

## 2020-12-09 LAB — COMPREHENSIVE METABOLIC PANEL
ALT: 19 U/L (ref 0–44)
AST: 16 U/L (ref 15–41)
Albumin: 3.9 g/dL (ref 3.5–5.0)
Alkaline Phosphatase: 54 U/L (ref 38–126)
Anion gap: 2 — ABNORMAL LOW (ref 5–15)
BUN: 8 mg/dL (ref 6–20)
CO2: 31 mmol/L (ref 22–32)
Calcium: 8.8 mg/dL — ABNORMAL LOW (ref 8.9–10.3)
Chloride: 104 mmol/L (ref 98–111)
Creatinine, Ser: 0.89 mg/dL (ref 0.44–1.00)
GFR, Estimated: >60 mL/min (ref >60)
Glucose, Bld: 89 mg/dL (ref 70–99)
Potassium: 3.8 mmol/L (ref 3.5–5.1)
Sodium: 137 mmol/L (ref 135–145)
Total Bilirubin: 0.5 mg/dL (ref 0.3–1.2)
Total Protein: 6.8 g/dL (ref 6.5–8.1)

## 2020-12-09 LAB — URINALYSIS, ROUTINE W REFLEX MICROSCOPIC
Bilirubin Urine: NEGATIVE
Glucose, UA: NEGATIVE mg/dL
Hgb urine dipstick: NEGATIVE
Ketones, ur: NEGATIVE mg/dL
Leukocytes,Ua: NEGATIVE
Nitrite: NEGATIVE
Protein, ur: NEGATIVE mg/dL
Specific Gravity, Urine: 1.023 (ref 1.005–1.030)
pH: 5 (ref 5.0–8.0)

## 2020-12-09 LAB — CBC
HCT: 36.9 % (ref 36.0–46.0)
Hemoglobin: 13.6 g/dL (ref 12.0–15.0)
MCH: 30.3 pg (ref 26.0–34.0)
MCHC: 36.9 g/dL — ABNORMAL HIGH (ref 30.0–36.0)
MCV: 82.2 fL (ref 80.0–100.0)
Platelets: 197 10*3/uL (ref 150–400)
RBC: 4.49 MIL/uL (ref 3.87–5.11)
RDW: 14.3 % (ref 11.5–15.5)
WBC: 4.3 10*3/uL (ref 4.0–10.5)
nRBC: 0 % (ref 0.0–0.2)

## 2020-12-09 LAB — I-STAT BETA HCG BLOOD, ED (MC, WL, AP ONLY): I-stat hCG, quantitative: <5 m[IU]/mL (ref <5)

## 2020-12-09 LAB — LIPASE, BLOOD: Lipase: 34 U/L (ref 11–51)

## 2020-12-09 LAB — POC URINE PREG, ED: Preg Test, Ur: NEGATIVE

## 2020-12-09 MED ORDER — MORPHINE SULFATE (PF) 4 MG/ML IV SOLN: 4.0000 mg | Freq: Once | INTRAVENOUS | Status: DC

## 2020-12-09 MED ORDER — FENTANYL CITRATE (PF) 100 MCG/2ML IJ SOLN
50.0000 ug | Freq: Once | INTRAMUSCULAR | Status: AC
  Administered 2020-12-09: 50 ug via INTRAVENOUS
  Filled 2020-12-09: qty 2

## 2020-12-09 MED ORDER — IOHEXOL 300 MG/ML  SOLN
100.0000 mL | Freq: Once | INTRAMUSCULAR | Status: AC | PRN
  Administered 2020-12-09: 100 mL via INTRAVENOUS

## 2020-12-09 MED ORDER — ONDANSETRON HCL 4 MG/2ML IJ SOLN
4.0000 mg | Freq: Once | INTRAMUSCULAR | Status: AC
  Administered 2020-12-09: 4 mg via INTRAVENOUS
  Filled 2020-12-09: qty 2

## 2020-12-09 MED ORDER — DICYCLOMINE HCL 20 MG PO TABS: 20.0000 mg | ORAL_TABLET | Freq: Two times a day (BID) | ORAL | 0 refills | Status: DC | PRN

## 2020-12-09 NOTE — ED Provider Notes (Signed)
[facility]    CSN: [id number] Arrival date & time: 12/09/20  1010      History   Chief Complaint Chief Complaint  Patient presents with  . Abdominal Pain    HPI Darlene Bowen is a 45 y.o. female.   Patient presents with on going left lateral abdominal pain and left mid to lower back pain for 2 weeks.  She states the pain is getting worse.  She also reports nausea but denies vomiting or diarrhea.  She denies fever, chills, or other symptoms.  Patient was seen at [facility] urgent care on 11/28/2020; diagnosed with left flank pain and acute left lower back pain without sciatica; patient left before abdominal x-ray obtained.  She was seen again on 11/28/2020; diagnosed with acute left-sided back pain and left lateral abdominal pain; abdominal 1 view x-ray showed no acute abnormality; recommended treatment with stool softener, laxative, clear liquids; ED precautions given.      The history is provided by the patient and medical records.    Past Medical History:  Diagnosis Date  . Multiple sclerosis (HCC)     There are no problems to display for this patient.   History reviewed. No pertinent surgical history.  OB History   No obstetric history on file.      Home Medications    Prior to Admission medications   Medication Sig Start Date End Date Taking? Authorizing Provider  acetaZOLAMIDE (DIAMOX) 250 MG tablet Take 250 mg by mouth 2 (two) times daily.    [provider]  pregabalin (LYRICA) 50 MG capsule Take 50 mg by mouth. Take 50mg  daily in the morning and 100mg  at lunch and at bedtime    [provider]    Family History History reviewed. No pertinent family history.  Social History Social History   Tobacco Use  . Smoking status: Never Smoker  . Smokeless tobacco: Never Used     Allergies   Codeine, Macrobid [nitrofurantoin], and Penicillins   Review of Systems Review of Systems  Constitutional: Negative for chills and fever.   HENT: Negative for ear pain and sore throat.   Eyes: Negative for pain and visual disturbance.  Respiratory: Negative for cough and shortness of breath.   Cardiovascular: Negative for chest pain and palpitations.  Gastrointestinal: Positive for abdominal pain and nausea. Negative for constipation, diarrhea and vomiting.  Genitourinary: Negative for dysuria, hematuria and pelvic pain.  Musculoskeletal: Negative for arthralgias and back pain.  Skin: Negative for color change and rash.  Neurological: Negative for seizures and syncope.  All other systems reviewed and are negative.    Physical Exam Triage Vital Signs ED Triage Vitals  Enc Vitals Group     BP 12/09/20 1052 130/84     Pulse Rate 12/09/20 1052 64     Resp 12/09/20 1052 16     Temp 12/09/20 1052 97.8 F (36.6 C)     Temp Source 12/09/20 1052 Oral     SpO2 12/09/20 1052 97 %     Weight --      Height --      Head Circumference --      Peak Flow --      Pain Score 12/09/20 1049 9     Pain Loc --      Pain Edu? --      Excl. in GC? --    No data found.  Updated Vital Signs BP 130/84 (BP Location: Left Arm)   Pulse 64   Temp  97.8 F (36.6 C) (Oral)   Resp 16   LMP 11/15/2020 (Exact Date)   SpO2 97%   Visual Acuity Right Eye Distance:   Left Eye Distance:   Bilateral Distance:    Right Eye Near:   Left Eye Near:    Bilateral Near:     Physical Exam Vitals and nursing note reviewed.  Constitutional:      General: She is not in acute distress.    Appearance: She is well-developed. She is obese. She is not ill-appearing.  HENT:     Head: Normocephalic and atraumatic.     Mouth/Throat:     Mouth: Mucous membranes are moist.  Eyes:     Conjunctiva/sclera: Conjunctivae normal.  Cardiovascular:     Rate and Rhythm: Normal rate and regular rhythm.     Heart sounds: Normal heart sounds.  Pulmonary:     Effort: Pulmonary effort is normal. No respiratory distress.     Breath sounds: Normal breath sounds.   Abdominal:     General: Bowel sounds are normal. There is no distension.     Palpations: Abdomen is soft.     Tenderness: There is abdominal tenderness in the left upper quadrant and left lower quadrant. There is no right CVA tenderness, left CVA tenderness, guarding or rebound.  Musculoskeletal:     Cervical back: Neck supple.  Skin:    General: Skin is warm and dry.  Neurological:     General: No focal deficit present.     Mental Status: She is alert and oriented to person, place, and time.     Gait: Gait normal.  Psychiatric:        Mood and Affect: Mood normal.        Behavior: Behavior normal.      UC Treatments / Results  Labs (all labs ordered are listed, but only abnormal results are displayed) Labs Reviewed  POCT URINALYSIS DIPSTICK, ED / UC  POC URINE PREG, ED    EKG   Radiology No results found.  Procedures Procedures (including critical care time)  Medications Ordered in UC Medications - No data to display  Initial Impression / Assessment and Plan / UC Course  I have reviewed the triage vital signs and the nursing notes.  Pertinent labs & imaging results that were available during my care of the patient were reviewed by me and considered in my medical decision making (see chart for details).   Left lateral abdominal pain, acute left lower back pain without sciatica.  Patient is tender to palpation of her left upper and lower abdomen.  She states the pain has been getting progressively worse over the past couple of weeks.  This is her third visit to the urgent care while having this pain.  Sending her to the ED for evaluation.  She declines EMS and states she is stable to drive herself.  Vital signs are stable.   Final Clinical Impressions(s) / UC Diagnoses   Final diagnoses:  Left lateral abdominal pain  Acute left-sided low back pain without sciatica     Discharge Instructions     Go to the emergency department for evaluation of your abdominal  and back pain.        ED Prescriptions    None     PDMP not reviewed this encounter.   Mickie Bail, NP 12/09/20 [phone]

## 2020-12-09 NOTE — ED Notes (Signed)
Pt tearful upon discharge due to pain and worried about long wait time for GI follow-up. Pt ambulatory upon discharge.

## 2020-12-09 NOTE — ED Notes (Signed)
Patient is being discharged from the Urgent Care and sent to the Emergency Department via POV . Per Wendee Beavers, patient is in need of higher level of care due to abdominal pain. Patient is aware and verbalizes understanding of plan of care.  Vitals:   12/09/20 1052  BP: 130/84  Pulse: 64  Resp: 16  Temp: 97.8 F (36.6 C)  SpO2: 97%

## 2020-12-09 NOTE — ED Triage Notes (Signed)
Pt c/o lower abdominal pain. She states she feels her kidneys are about to explode. She states the pain is affecting her sleep and when she walks.

## 2020-12-09 NOTE — Discharge Instructions (Addendum)
Take the medications as needed for pain.  Follow-up with a GI doctor for further evaluation

## 2020-12-09 NOTE — Discharge Instructions (Signed)
Go to the emergency department for evaluation of your abdominal and back pain.

## 2020-12-09 NOTE — ED Triage Notes (Signed)
C/o L flank pain that radiates to LLQ/pelvic x 1 week.  States she had an x-ray at [facility] 3/29 and taking Miralax without improvement of pain.

## 2020-12-09 NOTE — ED Notes (Signed)
Patient transported to CT 

## 2020-12-09 NOTE — ED Provider Notes (Signed)
[facility] EMERGENCY DEPARTMENT Provider Note   CSN: [id number] Arrival date & time: 12/09/20  1135     History Chief Complaint  Patient presents with  . Flank Pain    Darlene Bowen is a 45 y.o. female.  Has left-sided abdominal pain.'s been going on for a week getting worse.  She has intermittently abnormal bowel movements.  She denies fevers chills but she has having nausea vomiting.  She is urinating normally.  She has normal menstrual cycles.  She has no vaginal discharge.  She denies any fevers.  She is tried no medication to help.  Pain is mild to moderate.        Past Medical History:  Diagnosis Date  . Multiple sclerosis (HCC)     There are no problems to display for this patient.   History reviewed. No pertinent surgical history.   OB History   No obstetric history on file.     No family history on file.  Social History   Tobacco Use  . Smoking status: Never Smoker  . Smokeless tobacco: Never Used  Substance Use Topics  . Alcohol use: Not Currently  . Drug use: Yes    Types: Marijuana    Home Medications Prior to Admission medications   Medication Sig Start Date End Date Taking? Authorizing Provider  dicyclomine (BENTYL) 20 MG tablet Take 1 tablet (20 mg total) by mouth 2 (two) times daily as needed for spasms. 12/09/20  Yes Linwood Dibbles, MD  acetaZOLAMIDE (DIAMOX) 250 MG tablet Take 250 mg by mouth 2 (two) times daily.    [provider]  pregabalin (LYRICA) 50 MG capsule Take 50 mg by mouth. Take 50mg  daily in the morning and 100mg  at lunch and at bedtime    [provider]    Allergies    Codeine, Macrobid [nitrofurantoin], and Penicillins  Review of Systems   Review of Systems  Constitutional: Negative for chills and fever.  HENT: Negative for congestion and rhinorrhea.   Respiratory: Negative for cough and shortness of breath.   Cardiovascular: Negative for chest pain and palpitations.  Gastrointestinal:  Positive for abdominal pain and nausea. Negative for diarrhea and vomiting.  Genitourinary: Negative for difficulty urinating and dysuria.  Musculoskeletal: Negative for arthralgias and back pain.  Skin: Negative for rash and wound.  Neurological: Negative for light-headedness and headaches.    Physical Exam Updated Vital Signs BP 130/90   Pulse 68   Temp 97.8 F (36.6 C)   Resp 20   LMP 11/15/2020 (Exact Date)   SpO2 99%   Physical Exam Vitals and nursing note reviewed. Exam conducted with a chaperone present.  Constitutional:      General: She is not in acute distress.    Appearance: Normal appearance.  HENT:     Head: Normocephalic and atraumatic.     Nose: No rhinorrhea.  Eyes:     General:        Right eye: No discharge.        Left eye: No discharge.     Conjunctiva/sclera: Conjunctivae normal.  Cardiovascular:     Rate and Rhythm: Normal rate and regular rhythm.  Pulmonary:     Effort: Pulmonary effort is normal. No respiratory distress.     Breath sounds: No stridor. No wheezing.  Abdominal:     General: Abdomen is flat. There is no distension.     Palpations: Abdomen is soft.     Tenderness: There is abdominal tenderness in the  left upper quadrant and left lower quadrant. There is left CVA tenderness. There is no guarding or rebound.  Musculoskeletal:        General: No tenderness or signs of injury.  Skin:    General: Skin is warm and dry.  Neurological:     General: No focal deficit present.     Mental Status: She is alert. Mental status is at baseline.     Motor: No weakness.  Psychiatric:        Mood and Affect: Mood normal.        Behavior: Behavior normal.     ED Results / Procedures / Treatments   Labs (all labs ordered are listed, but only abnormal results are displayed) Labs Reviewed  COMPREHENSIVE METABOLIC PANEL - Abnormal; Notable for the following components:      Result Value   Calcium 8.8 (*)    Anion gap 2 (*)    All other  components within normal limits  CBC - Abnormal; Notable for the following components:   MCHC 36.9 (*)    All other components within normal limits  URINALYSIS, ROUTINE W REFLEX MICROSCOPIC - Abnormal; Notable for the following components:   APPearance HAZY (*)    All other components within normal limits  LIPASE, BLOOD  I-STAT BETA HCG BLOOD, ED ([facility], [facility], [facility] ONLY)    EKG None  Radiology CT ABDOMEN PELVIS W CONTRAST  Result Date: 12/09/2020 CLINICAL DATA:  45 year old female with acute LEFT-sided abdominal and pelvic pain. EXAM: CT ABDOMEN AND PELVIS WITH CONTRAST TECHNIQUE: Multidetector CT imaging of the abdomen and pelvis was performed using the standard protocol following bolus administration of intravenous contrast. CONTRAST:  OMNIPAQUE IOHEXOL 300 MG/ML  SOLN COMPARISON:  None. FINDINGS: Lower chest: No acute abnormality. Hepatobiliary: Probable mild hepatic steatosis noted without other hepatic abnormality. The gallbladder is unremarkable. No biliary dilatation. Pancreas: Unremarkable Spleen: Unremarkable Adrenals/Urinary Tract: The kidneys, adrenal glands and bladder are unremarkable. Stomach/Bowel: Stomach is within normal limits. Appendix appears normal. No evidence of bowel wall thickening, distention, or inflammatory changes. Vascular/Lymphatic: No significant vascular findings are present. No enlarged abdominal or pelvic lymph nodes. Reproductive: Uterus and bilateral adnexa are unremarkable. Other: A trace amount of free pelvic fluid is likely physiologic. No focal collection or pneumoperitoneum identified. Musculoskeletal: No acute or significant osseous findings. IMPRESSION: 1. No evidence of acute abnormality. 2. Probable mild hepatic steatosis. Electronically Signed   By: Harmon Pier M.D.   On: 12/09/2020 15:52    Procedures Procedures   Medications Ordered in ED Medications  ondansetron (ZOFRAN) injection 4 mg (4 mg Intravenous Given 12/09/20 1456)  fentaNYL (SUBLIMAZE)  injection 50 mcg (50 mcg Intravenous Given 12/09/20 1456)  iohexol (OMNIPAQUE) 300 MG/ML solution 100 mL (100 mLs Intravenous Contrast Given 12/09/20 1528)    ED Course  I have reviewed the triage vital signs and the nursing notes.  Pertinent labs & imaging results that were available during my care of the patient were reviewed by me and considered in my medical decision making (see chart for details).    MDM Rules/Calculators/A&P                          LLQ/flank pain worsening over the last week.  No fevers chills here.  Hemodynamically stable.  Not peritonitis on exam but focally tender on the left.  Will get CT with contrast to evaluate for diverticulitis or other complication as the patient has abnormal stool history  to involve constipation.  Otherwise patient is looking well and tolerating p.o.  Likely stable for outpatient management.  Pt care was handed off to on coming provider at 1530.  Complete history and physical and current plan have been communicated.  Please refer to their note for the remainder of ED care and ultimate disposition.  Pt seen in conjunction with Dr. Lynelle Doctor    Final Clinical Impression(s) / ED Diagnoses Final diagnoses:  Abdominal pain, unspecified abdominal location    Rx / DC Orders ED Discharge Orders         Ordered    dicyclomine (BENTYL) 20 MG tablet  2 times daily PRN        12/09/20 1700           Sabino Donovan, MD 12/10/20 [phone]

## 2020-12-09 NOTE — ED Provider Notes (Signed)
Pt seen by Dr Myrtis Ser.  Please see his note.  Plan is to follow up on CT  CT scan findings reviewed.  No acute findings noted.  Results discussed with patient   Linwood Dibbles, MD 12/09/20 1659

## 2020-12-20 ENCOUNTER — Other Ambulatory Visit: Payer: Self-pay

## 2020-12-26 ENCOUNTER — Ambulatory Visit (INDEPENDENT_AMBULATORY_CARE_PROVIDER_SITE_OTHER): Payer: Medicaid Other | Admitting: Nurse Practitioner

## 2020-12-26 ENCOUNTER — Encounter: Payer: Self-pay | Admitting: Nurse Practitioner

## 2020-12-26 VITALS — BP 120/88 | HR 92 | Ht 64.0 in | Wt 235.0 lb

## 2020-12-26 DIAGNOSIS — R1311 Dysphagia, oral phase: Secondary | ICD-10-CM

## 2020-12-26 DIAGNOSIS — R1310 Dysphagia, unspecified: Secondary | ICD-10-CM | POA: Insufficient documentation

## 2020-12-26 DIAGNOSIS — R109 Unspecified abdominal pain: Secondary | ICD-10-CM | POA: Diagnosis not present

## 2020-12-26 DIAGNOSIS — K5900 Constipation, unspecified: Secondary | ICD-10-CM

## 2020-12-26 DIAGNOSIS — R1319 Other dysphagia: Secondary | ICD-10-CM | POA: Diagnosis not present

## 2020-12-26 NOTE — Progress Notes (Signed)
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  Past foot surgery and perirectal abscess excision in 2006.  She presents to our office today as referred by Dr. Julien Girt for further evaluation regarding intermittent left abdominal flank for the past year.   She presented to the urgent care clinic on 11/28/2020 with left flank and lower back pain.  No significant findings on abdominal x-ray.  She was advised to maintain a clear liquid diet for 24 hours and to take a stool softener or laxative to increase stool output.  Her left flank pain and back pain progressively worsened over the next week. She presented back to the urgent care clinic on 12/09/2020 she was sent to the ED for further evaluation.  Abdominal/pelvic CT scan showed mild hepatic steatosis without any intra-abdominal/intrapelvic laboratory or infectious process.  She was discharged home on Dicyclomine 20 mg p.o. twice daily.    She complains of having constipation with difficulty moving stool out.  She describes having left flank pain which somewhat radiates to the left lower quadrant at times.  She denies having any rectal bleeding or melena.  No nausea or vomiting.  No heartburn.  She complains of having dysphagia.  She has difficulty getting food past her tongue to swallow which occurs daily for the past year.  She is followed by neurology at [facility] for numerous neurological symptoms, MS not confirmed.  She stated having lesions on the back of her cerebrum per brain MRI.  Past left-sided Bell's palsy.  She has facial/eye pain, lower extremity paresthesias, burning sensation throughout her body and memory  issues.  Recent umbar puncture with normal CSF testing without evidence of multiple sclerosis there is apparently evidence of a pressure in the brain for which she was started on Acetazolamide.  She is scheduled for a repeat brain MRI next week.  CBC Latest Ref Rng & Units 12/09/2020  WBC 4.0 - 10.5 K/uL 4.3  Hemoglobin 12.0 - 15.0 g/dL 00.8  Hematocrit 67.6 - 46.0 % 36.9  Platelets 150 - 400 K/uL 197    CMP Latest Ref Rng & Units 12/09/2020  Glucose 70 - 99 mg/dL 89  BUN 6 - 20 mg/dL 8  Creatinine 1.95 - 0.93 mg/dL 2.67  Sodium 124 - 580 mmol/L 137  Potassium 3.5 - 5.1 mmol/L 3.8  Chloride 98 - 111 mmol/L 104  CO2 22 - 32 mmol/L 31  Calcium 8.9 - 10.3 mg/dL 9.9(I)  Total Protein 6.5 - 8.1 g/dL 6.8  Total Bilirubin 0.3 - 1.2 mg/dL 0.5  Alkaline Phos 38 - 126 U/L 54  AST 15 - 41 U/L 16  ALT 0 - 44 U/L 19   CTAP 12/09/2020: 1. No evidence of acute abnormality. 2. Probable mild hepatic steatosis.  Brain MRI 03/03/2020: Unchanged nonenhancing T2 hyperintense and T1 hypointense lesions within  the left dorsal pons, right dorsal medulla and right frontal lobe.   Left lower extremity Doppler 10/13/2019: There is occlusive thrombus within the small veins within the left gastrocnemius muscle. The major upper calf veins (ATV, PTV, and peroneal vein) and the remainder of the left lower extremity are patent.  Social history: She is single.  She has 1 son and 2 daughters.  She is a Scientist, research (life sciences).  She quit smoking cigarettes 10/2019.  No alcohol use.  Past marijuana use.  Family History: Mother with history of diabetes.  Father with history of hypertension and hyperlipidemia.  No known family history of esophageal, gastric or colon cancer.  Allergies  Allergen Reactions  . Codeine Anaphylaxis  . Macrobid [Nitrofurantoin] Anaphylaxis  . Penicillins      Outpatient Encounter Medications as of 12/26/2020  Medication Sig  . acetaZOLAMIDE (DIAMOX) 250 MG tablet Take 250 mg by mouth 2 (two)  times daily.  Marland Kitchen apixaban (ELIQUIS) 5 MG TABS tablet Eliquis 5 mg tablet  . Cholecalciferol 50 MCG (2000 UT) TABS Take by mouth.  Lennox Solders (EUCRISA) 2 % OINT Apply topically 2 (two) times daily.  . diclofenac Sodium (VOLTAREN) 1 % GEL diclofenac 1 % topical gel  APPLY 2 GRAMS TO THE AFFECTED AREA(S) BY TOPICAL ROUTE 4 TIMES PER DAY  . dicyclomine (BENTYL) 20 MG tablet Take 1 tablet (20 mg total) by mouth 2 (two) times daily as needed for spasms.  . ferrous sulfate 325 (65 FE) MG EC tablet Take 1 tablet by mouth daily with breakfast.  . ibuprofen (ADVIL) 800 MG tablet Take 800 mg by mouth 3 (three) times daily as needed.  . Melatonin 10 MG TABS Take by mouth.  . pregabalin (LYRICA) 50 MG capsule Take 50 mg by mouth. Take 50mg  daily in the morning and 100mg  at lunch and at bedtime  . TEGRETOL-XR 100 MG 12 hr tablet Take 100 mg by mouth 2 (two) times daily.  . valACYclovir HCl (VALTREX PO) valacyclovir   No facility-administered encounter medications on file as of 12/26/2020.   REVIEW OF SYSTEMS:  Gen: Denies fever, sweats or chills. No weight loss.  CV: Denies chest pain, palpitations or edema. Resp: Denies cough, shortness of breath of hemoptysis.  GI: See HPI GU :+ Increased urinary frequency.   MS: Denies joint pain, muscles aches or weakness. Derm: Denies rash, itchiness, skin lesions or unhealing ulcers. Psych: Denies anxiety or depression. Heme: Denies bruising, bleeding. Neuro: See HPI. Endo:  Denies any problems with DM, thyroid or adrenal function.   PHYSICAL EXAM: BP 120/88   Pulse 92   Ht 5\' 4"  (1.626 m)   Wt 235 lb (106.6 kg)   SpO2 99%   BMI 40.34 kg/m   General: 45 year old female in no acute distress. Head: Normocephalic and atraumatic. Eyes:  Sclerae non-icteric, conjunctive pink. Ears: Normal auditory acuity. Mouth: Dentition intact. No ulcers or lesions.  Neck: Supple, no lymphadenopathy or thyromegaly.  Lungs: Clear bilaterally to auscultation without  wheezes, crackles or rhonchi. Heart: Regular rate and rhythm. No murmur, rub or gallop appreciated.  Abdomen: Soft, nontender, non distended. No masses. No hepatosplenomegaly. Normoactive bowel sounds x 4 quadrants. No CVA tenderness.  Rectal: Deferred. Musculoskeletal: Symmetrical with no gross deformities. Skin: Warm and dry. No rash or lesions on visible extremities. Extremities: No edema. Neurological: Alert oriented x 4, no focal deficits.  Psychological:  Alert and cooperative. Normal mood and affect.  ASSESSMENT AND PLAN:  81.  45 year old female with left flank pain with intermittent radiation to the LLQ pain. CTAP 12/2020 was unrevealing.  -MiraLAX nightly as tolerated, may take bid if needed -Eventual colonoscopy, deferred until neurological evaluation completed -Patient will call our office if her symptoms worsen  2.  Oral phase dysphagia -Barium swallow with type -Consider eventual EGD at time of colonoscopy, deferred until neurological evaluation completed  3. Neurological abnormalities without  defined diagnosis in setting of ongoing neurological evaluation.  See HPI.  4. History of IDA without anemia. On Ferrous Sulfate 325mg  QD  5. History of LLE gastrocnemius DVT, no longer on Eliquis       CC:  , Tomasa Rand

## 2020-12-26 NOTE — Patient Instructions (Signed)
If you are age 45 or older, your body mass index should be between 23-30. Your Body mass index is 40.34 kg/m. If this is out of the aforementioned range listed, please consider follow up with your Primary Care Provider.  If you are age 33 or younger, your body mass index should be between 19-25. Your Body mass index is 40.34 kg/m. If this is out of the aformentioned range listed, please consider follow up with your Primary Care Provider.   Due to recent changes in healthcare laws, you may see the results of your imaging and laboratory studies on MyChart before your provider has had a chance to review them.  We understand that in some cases there may be results that are confusing or concerning to you. Not all laboratory results come back in the same time frame and the provider may be waiting for multiple results in order to interpret others.  Please give [place] 48 hours in order for your provider to thoroughly review all the results before contacting the office for clarification of your results.   Please try IBGard over the counter for abdominal pain.   Miralax once or twice a day   Please call the office if symptoms worsen.  We will need Neuro clearance before scheduling   You have been scheduled for a Barium Esophogram at [facility] (1st floor of the hospital) on          at         Please arrive 15 minutes prior to your appointment for registration. Make certain not to have anything to eat or drink 3 hours prior to your test. If you need to reschedule for any reason, please contact radiology at [phone] to do so. __________________________________________________________________ A barium swallow is an examination that concentrates on views of the esophagus. This tends to be a double contrast exam (barium and two liquids which, when combined, create a gas to distend the wall of the oesophagus) or single contrast (non-ionic iodine based). The study is usually tailored to your symptoms so  a good history is essential. Attention is paid during the study to the form, structure and configuration of the esophagus, looking for functional disorders (such as aspiration, dysphagia, achalasia, motility and reflux) EXAMINATION You may be asked to change into a gown, depending on the type of swallow being performed. A radiologist and radiographer will perform the procedure. The radiologist will advise you of the type of contrast selected for your procedure and direct you during the exam. You will be asked to stand, sit or lie in several different positions and to hold a small amount of fluid in your mouth before being asked to swallow while the imaging is performed .In some instances you may be asked to swallow barium coated marshmallows to assess the motility of a solid food bolus. The exam can be recorded as a digital or video fluoroscopy procedure. POST PROCEDURE It will take 1-2 days for the barium to pass through your system. To facilitate this, it is important, unless otherwise directed, to increase your fluids for the next 24-48hrs and to resume your normal diet.  This test typically takes about 30 minutes to perform. __________________________________________________________________________________

## 2020-12-26 NOTE — Progress Notes (Signed)
Agree with assessment and plan as outlined.  

## 2021-01-04 DIAGNOSIS — G932 Benign intracranial hypertension: Secondary | ICD-10-CM | POA: Diagnosis not present

## 2021-01-04 DIAGNOSIS — M6281 Muscle weakness (generalized): Secondary | ICD-10-CM | POA: Diagnosis not present

## 2021-01-04 DIAGNOSIS — G43009 Migraine without aura, not intractable, without status migrainosus: Secondary | ICD-10-CM | POA: Diagnosis not present

## 2021-01-04 DIAGNOSIS — R202 Paresthesia of skin: Secondary | ICD-10-CM | POA: Diagnosis not present

## 2021-01-10 DIAGNOSIS — K5900 Constipation, unspecified: Secondary | ICD-10-CM | POA: Diagnosis not present

## 2021-01-10 DIAGNOSIS — L309 Dermatitis, unspecified: Secondary | ICD-10-CM | POA: Diagnosis not present

## 2021-01-10 DIAGNOSIS — L219 Seborrheic dermatitis, unspecified: Secondary | ICD-10-CM | POA: Diagnosis not present

## 2021-01-10 DIAGNOSIS — G35 Multiple sclerosis: Secondary | ICD-10-CM | POA: Diagnosis not present

## 2021-02-27 ENCOUNTER — Emergency Department (HOSPITAL_COMMUNITY)
Admission: EM | Admit: 2021-02-27 | Discharge: 2021-02-27 | Disposition: A | Discharge status: Home / Self Care | Payer: Medicaid Other | Attending: Emergency Medicine | Admitting: Emergency Medicine

## 2021-02-27 ENCOUNTER — Emergency Department (HOSPITAL_COMMUNITY): Payer: Medicaid Other

## 2021-02-27 ENCOUNTER — Other Ambulatory Visit: Payer: Self-pay

## 2021-02-27 ENCOUNTER — Encounter (HOSPITAL_COMMUNITY): Payer: Self-pay | Admitting: Emergency Medicine

## 2021-02-27 ENCOUNTER — Ambulatory Visit (HOSPITAL_COMMUNITY)
Admission: EM | Admit: 2021-02-27 | Discharge: 2021-02-27 | Disposition: A | Discharge status: Home / Self Care | Payer: Medicaid Other

## 2021-02-27 DIAGNOSIS — R102 Pelvic and perineal pain: Secondary | ICD-10-CM

## 2021-02-27 DIAGNOSIS — N83209 Unspecified ovarian cyst, unspecified side: Secondary | ICD-10-CM

## 2021-02-27 DIAGNOSIS — Z7901 Long term (current) use of anticoagulants: Secondary | ICD-10-CM | POA: Insufficient documentation

## 2021-02-27 DIAGNOSIS — R350 Frequency of micturition: Secondary | ICD-10-CM

## 2021-02-27 DIAGNOSIS — G35 Multiple sclerosis: Secondary | ICD-10-CM

## 2021-02-27 DIAGNOSIS — R194 Change in bowel habit: Secondary | ICD-10-CM | POA: Diagnosis not present

## 2021-02-27 DIAGNOSIS — N83202 Unspecified ovarian cyst, left side: Secondary | ICD-10-CM | POA: Diagnosis not present

## 2021-02-27 DIAGNOSIS — R3989 Other symptoms and signs involving the genitourinary system: Secondary | ICD-10-CM | POA: Insufficient documentation

## 2021-02-27 DIAGNOSIS — Z0389 Encounter for observation for other suspected diseases and conditions ruled out: Secondary | ICD-10-CM | POA: Diagnosis not present

## 2021-02-27 LAB — COMPREHENSIVE METABOLIC PANEL
ALT: 21 U/L (ref 0–44)
AST: 22 U/L (ref 15–41)
Albumin: 3.8 g/dL (ref 3.5–5.0)
Alkaline Phosphatase: 53 U/L (ref 38–126)
Anion gap: 12 (ref 5–15)
BUN: 8 mg/dL (ref 6–20)
CO2: 25 mmol/L (ref 22–32)
Calcium: 8.9 mg/dL (ref 8.9–10.3)
Chloride: 101 mmol/L (ref 98–111)
Creatinine, Ser: 0.85 mg/dL (ref 0.44–1.00)
GFR, Estimated: >60 mL/min (ref >60)
Glucose, Bld: 90 mg/dL (ref 70–99)
Potassium: 4.3 mmol/L (ref 3.5–5.1)
Sodium: 138 mmol/L (ref 135–145)
Total Bilirubin: 0.9 mg/dL (ref 0.3–1.2)
Total Protein: 6.6 g/dL (ref 6.5–8.1)

## 2021-02-27 LAB — URINALYSIS, ROUTINE W REFLEX MICROSCOPIC
Bilirubin Urine: NEGATIVE
Glucose, UA: NEGATIVE mg/dL
Hgb urine dipstick: NEGATIVE
Ketones, ur: NEGATIVE mg/dL
Leukocytes,Ua: NEGATIVE
Nitrite: NEGATIVE
Protein, ur: NEGATIVE mg/dL
Specific Gravity, Urine: 1.017 (ref 1.005–1.030)
pH: 6 (ref 5.0–8.0)

## 2021-02-27 LAB — CBC
HCT: 37.6 % (ref 36.0–46.0)
Hemoglobin: 13.5 g/dL (ref 12.0–15.0)
MCH: 30.1 pg (ref 26.0–34.0)
MCHC: 35.9 g/dL (ref 30.0–36.0)
MCV: 83.7 fL (ref 80.0–100.0)
Platelets: 231 10*3/uL (ref 150–400)
RBC: 4.49 MIL/uL (ref 3.87–5.11)
RDW: 13.9 % (ref 11.5–15.5)
WBC: 4.4 10*3/uL (ref 4.0–10.5)
nRBC: 0 % (ref 0.0–0.2)

## 2021-02-27 LAB — WET PREP, GENITAL
Clue Cells Wet Prep HPF POC: NONE SEEN
Sperm: NONE SEEN
Trich, Wet Prep: NONE SEEN
Yeast Wet Prep HPF POC: NONE SEEN

## 2021-02-27 LAB — I-STAT BETA HCG BLOOD, ED (MC, WL, AP ONLY): I-stat hCG, quantitative: <5 m[IU]/mL (ref <5)

## 2021-02-27 LAB — LIPASE, BLOOD: Lipase: 35 U/L (ref 11–51)

## 2021-02-27 MED ORDER — FENTANYL CITRATE (PF) 100 MCG/2ML IJ SOLN
50.0000 ug | Freq: Once | INTRAMUSCULAR | Status: AC
Start: 2021-02-27 — End: 2021-02-27
  Administered 2021-02-27: 50 ug via INTRAVENOUS
  Filled 2021-02-27: qty 2

## 2021-02-27 MED ORDER — ONDANSETRON 4 MG PO TBDP
8.0000 mg | ORAL_TABLET | Freq: Once | ORAL | Status: AC
  Administered 2021-02-27: 8 mg via ORAL
  Filled 2021-02-27: qty 2

## 2021-02-27 MED ORDER — KETOROLAC TROMETHAMINE 30 MG/ML IJ SOLN
30.0000 mg | Freq: Once | INTRAMUSCULAR | Status: AC
  Administered 2021-02-27: 30 mg via INTRAVENOUS
  Filled 2021-02-27: qty 1

## 2021-02-27 MED ORDER — ONDANSETRON HCL 4 MG PO TABS: 4.0000 mg | ORAL_TABLET | Freq: Four times a day (QID) | ORAL | 0 refills | Status: DC

## 2021-02-27 NOTE — ED Triage Notes (Signed)
Complains of left pelvic pain and pressure in left leg.  Patient has MS.  Patient has recently moved to [place] from [place] and does not have an OB/GYN.  Patient moved to [place] in march 2022.  [facility]  This pelvic pain is worse, has irregular periods and mother and grandmother had ovarian cancer.

## 2021-02-27 NOTE — ED Triage Notes (Signed)
C/o L sided pelvic pain x 1 year.  Reports history of MS.  Also reports increased urination, constipation, and feeling bloated x 1 month.  Irregular periods.

## 2021-02-27 NOTE — ED Provider Notes (Signed)
[facility]    CSN: [id number] Arrival date & time: 02/27/21  2440      History   Chief Complaint Chief Complaint  Patient presents with   Pelvic Pain    HPI Darlene Bowen is a 45 y.o. female.   Patient presenting today with acute on chronic left pelvic pain that has been present for over a year but significantly worsening the past week or so.  States the pain is an 8 out of 10 and seems to cause leg weakness, constipation, urinary frequency.  She feels like there is a mass in there or something squeezing her insides.  The pain is made worse by intercourse or direct pressure, states she has not had intercourse in a year now because of it.  Does have a history of MS followed closely by neurology which causes multiple neurologic manifestations such as lower extremity neuropathies, bladder dysfunctions.  Has a significant family history of ovarian cancer and states that her mother and grandmother had very similar symptoms to what she is experiencing prior to their diagnoses which has her very concerned.  Has been trying to establish care with a new primary care provider in the area but is unable to see someone until 2 weeks from now as she recently moved to the area and has a referral pending for OB/GYN but has not been able to get an appointment yet with them.  Following with GI for her bowel changes and constipation.   Past Medical History:  Diagnosis Date   Multiple sclerosis [facility])     Patient Active Problem List   Diagnosis Date Noted   Left flank pain 12/26/2020   Dysphagia 12/26/2020   Low iron 09/05/2020   DVT (deep venous thrombosis) (HCC) 02/15/2020   H/O Bell's palsy 02/15/2020   Vitamin B1 deficiency 02/15/2020   Foot pain 01/05/2018   Tailor's bunion 01/05/2018   Tobacco user 01/05/2018    History reviewed. No pertinent surgical history.  OB History   No obstetric history on file.      Home Medications    Prior to Admission medications    Medication Sig Start Date End Date Taking? Authorizing Provider  acetaZOLAMIDE (DIAMOX) 250 MG tablet Take 250 mg by mouth 2 (two) times daily.    [provider]  apixaban (ELIQUIS) 5 MG TABS tablet Eliquis 5 mg tablet Patient not taking: Reported on 02/27/2021    [provider]  Cholecalciferol 50 MCG (2000 UT) TABS Take by mouth.    [provider]  Crisaborole (EUCRISA) 2 % OINT Apply topically 2 (two) times daily. 07/07/20   [provider]  diclofenac Sodium (VOLTAREN) 1 % GEL diclofenac 1 % topical gel  APPLY 2 GRAMS TO THE AFFECTED AREA(S) BY TOPICAL ROUTE 4 TIMES PER DAY    [provider]  dicyclomine (BENTYL) 20 MG tablet Take 1 tablet (20 mg total) by mouth 2 (two) times daily as needed for spasms. 12/09/20   Linwood Dibbles, MD  ferrous sulfate 325 (65 FE) MG EC tablet Take 1 tablet by mouth daily with breakfast. Patient not taking: Reported on 02/27/2021 02/25/20 02/24/21  [provider]  ibuprofen (ADVIL) 800 MG tablet Take 800 mg by mouth 3 (three) times daily as needed. 09/15/20   [provider]  Melatonin 10 MG TABS Take by mouth.    [provider]  pregabalin (LYRICA) 50 MG capsule Take 50 mg by mouth. Take 50mg  daily in the morning and 100mg  at lunch and  at bedtime    [provider]  rizatriptan (MAXALT-MLT) 10 MG disintegrating tablet SMARTSIG:Sublingual 01/19/21   [provider]  TEGRETOL-XR 100 MG 12 hr tablet Take 100 mg by mouth 2 (two) times daily. Patient not taking: Reported on 02/27/2021 08/04/20   [provider]  valACYclovir HCl (VALTREX PO) valacyclovir    [provider]    Family History Family History  Problem Relation Age of Onset   Cancer Mother     Social History Social History   Tobacco Use   Smoking status: Never   Smokeless tobacco: Never  Vaping Use   Vaping Use: Never used  Substance Use Topics   Alcohol use: Not Currently   Drug use: Yes     Types: Marijuana     Allergies   Codeine, Macrobid [nitrofurantoin], and Penicillins   Review of Systems Review of Systems Per HPI  Physical Exam Triage Vital Signs ED Triage Vitals  Enc Vitals Group     BP 02/27/21 0901 (!) 132/98     Pulse Rate 02/27/21 0901 76     Resp 02/27/21 0901 18     Temp 02/27/21 0901 99 F (37.2 C)     Temp Source 02/27/21 0901 Oral     SpO2 02/27/21 0901 100 %     Weight --      Height --      Head Circumference --      Peak Flow --      Pain Score 02/27/21 0854 9     Pain Loc --      Pain Edu? --      Excl. in GC? --    No data found.  Updated Vital Signs BP (!) 132/98 (BP Location: Right Arm) Comment: large cuff  Pulse 76   Temp 99 F (37.2 C) (Oral)   Resp 18   LMP 01/11/2021   SpO2 100%   Visual Acuity Right Eye Distance:   Left Eye Distance:   Bilateral Distance:    Right Eye Near:   Left Eye Near:    Bilateral Near:      Physical exam abbreviated today as the decision was already made to refer her to the emergency department for further evaluation Physical Exam Vitals and nursing note reviewed.  Constitutional:      Appearance: Normal appearance. She is not ill-appearing.  HENT:     Head: Atraumatic.  Eyes:     Extraocular Movements: Extraocular movements intact.     Conjunctiva/sclera: Conjunctivae normal.  Cardiovascular:     Rate and Rhythm: Normal rate and regular rhythm.     Heart sounds: Normal heart sounds.  Pulmonary:     Effort: Pulmonary effort is normal. No respiratory distress.     Breath sounds: Normal breath sounds.  Genitourinary:    Comments: Deferred to the emergency department Musculoskeletal:     Cervical back: Normal range of motion and neck supple.     Comments: Gait at patient's baseline  Skin:    General: Skin is warm and dry.  Neurological:     Mental Status: She is alert and oriented to person, place, and time.  Psychiatric:        Thought Content: Thought content normal.         Judgment: Judgment normal.     Comments: Tearful     UC Treatments / Results  Labs (all labs ordered are listed, but only abnormal results are displayed) Labs Reviewed  POCT URINALYSIS DIPSTICK, ED /  UC  POC URINE PREG, ED    EKG   Radiology No results found.  Procedures Procedures (including critical care time)  Medications Ordered in UC Medications - No data to display  Initial Impression / Assessment and Plan / UC Course  I have reviewed the triage vital signs and the nursing notes.  Pertinent labs & imaging results that were available during my care of the patient were reviewed by me and considered in my medical decision making (see chart for details).     Given acute worsening of patient's pain symptoms, concern for symptoms of mass and family history of ovarian cancer, will refer to the emergency department for immediate further evaluation.  Did have a normal CT abdomen and pelvis 2 months ago in the emergency department but feels things have worsened since then.  Will defer urinalysis, labs for the emergency department as she plans to go directly over there.  She wishes to go via private vehicle and will have her daughter drive her.  She is hemodynamically stable for transport to the emergency department. Final Clinical Impressions(s) / UC Diagnoses   Final diagnoses:  Pelvic pain in female  Change in bowel habit  Urinary frequency  MS (multiple sclerosis) (HCC)     Discharge Instructions      Go directly to the ED for further evaluation     ED Prescriptions   None    PDMP not reviewed this encounter.   Particia Nearing, [place] 02/27/21 1126

## 2021-02-27 NOTE — Discharge Instructions (Addendum)
Go directly to the ED for further evaluation

## 2021-02-27 NOTE — ED Notes (Signed)
Pt with multiple symptoms with pelvic pain, "feels like something is growing, or hanging down. I also have been constipated. Lots of pain in left side-- " has been going on for 2 years. Pressure changes, meds have not been helping.  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

## 2021-02-27 NOTE — ED Provider Notes (Signed)
Accepted handoff at shift change from [facility]. Please see prior provider note for more detail.   Briefly: Patient is 45 y.o.   DDX: concern for urinary tract infection  Plan: Follow-up on urinalysis discharge home with appropriate treatment.     Physical Exam  BP 107/75   Pulse 68   Temp 97.8 F (36.6 C) (Oral)   Resp 17   LMP 01/11/2021   SpO2 97%   Physical Exam  ED Course/Procedures   Clinical Course as of 02/27/21 1537  Tue Feb 27, 2021  1140 I-Stat beta hCG blood, ED Not consistent with ectopic pregnancy. [HS]  1140 Comprehensive metabolic panel No electrolyte derangement, no elevated liver enzymes, no AKI. [HS]  1140 Lipase, blood Doubt pancreatitis. [HS]  1140 CBC No anemia.  No elevated white count concerning for infectious or inflammatory etiology. [HS]    Clinical Course User Index [HS] Theron Arista, PA-C    Procedures Results for orders placed or performed during the hospital encounter of 02/27/21  Wet prep, genital  Result Value Ref Range   Yeast Wet Prep HPF POC NONE SEEN NONE SEEN   Trich, Wet Prep NONE SEEN NONE SEEN   Clue Cells Wet Prep HPF POC NONE SEEN NONE SEEN   WBC, Wet Prep HPF POC FEW (A) NONE SEEN   Sperm NONE SEEN   Lipase, blood  Result Value Ref Range   Lipase 35 11 - 51 U/L  Comprehensive metabolic panel  Result Value Ref Range   Sodium 138 135 - 145 mmol/L   Potassium 4.3 3.5 - 5.1 mmol/L   Chloride 101 98 - 111 mmol/L   CO2 25 22 - 32 mmol/L   Glucose, Bld 90 70 - 99 mg/dL   BUN 8 6 - 20 mg/dL   Creatinine, Ser 1.54 0.44 - 1.00 mg/dL   Calcium 8.9 8.9 - 00.8 mg/dL   Total Protein 6.6 6.5 - 8.1 g/dL   Albumin 3.8 3.5 - 5.0 g/dL   AST 22 15 - 41 U/L   ALT 21 0 - 44 U/L   Alkaline Phosphatase 53 38 - 126 U/L   Total Bilirubin 0.9 0.3 - 1.2 mg/dL   GFR, Estimated >67 >61 mL/min   Anion gap 12 5 - 15  CBC  Result Value Ref Range   WBC 4.4 4.0 - 10.5 K/uL   RBC 4.49 3.87 - 5.11 MIL/uL   Hemoglobin 13.5 12.0 -  15.0 g/dL   HCT 95.0 93.2 - 67.1 %   MCV 83.7 80.0 - 100.0 fL   MCH 30.1 26.0 - 34.0 pg   MCHC 35.9 30.0 - 36.0 g/dL   RDW 24.5 80.9 - 98.3 %   Platelets 231 150 - 400 K/uL   nRBC 0.0 0.0 - 0.2 %  Urinalysis, Routine w reflex microscopic Urine, Clean Catch  Result Value Ref Range   Color, Urine YELLOW YELLOW   APPearance CLEAR CLEAR   Specific Gravity, Urine 1.017 1.005 - 1.030   pH 6.0 5.0 - 8.0   Glucose, UA NEGATIVE NEGATIVE mg/dL   Hgb urine dipstick NEGATIVE NEGATIVE   Bilirubin Urine NEGATIVE NEGATIVE   Ketones, ur NEGATIVE NEGATIVE mg/dL   Protein, ur NEGATIVE NEGATIVE mg/dL   Nitrite NEGATIVE NEGATIVE   Leukocytes,Ua NEGATIVE NEGATIVE  I-Stat beta hCG blood, ED  Result Value Ref Range   I-stat hCG, quantitative <5.0 <5 mIU/mL   Comment 3           US PELVIC COMPLETE WITH TRANSVAGINAL  Result Date: 02/27/2021 CLINICAL DATA:  Concern for left ovarian cyst. EXAM: TRANSABDOMINAL AND TRANSVAGINAL ULTRASOUND OF PELVIS TECHNIQUE: Both transabdominal and transvaginal ultrasound examinations of the pelvis were performed. Transabdominal technique was performed for global imaging of the pelvis including uterus, ovaries, adnexal regions, and pelvic cul-de-sac. It was necessary to proceed with endovaginal exam following the transabdominal exam to visualize the endometrium. COMPARISON:  CT scan 12/09/2020 FINDINGS: Uterus Measurements: 12.6 x 5.7 x 6.4 cm = volume: To 137 mL. No fibroids or other mass visualized. Endometrium Thickness: 10 mm.  No focal abnormality visualized. Right ovary Measurements: 2.7 x 2.6 x 3.6 cm = volume: 14 mL.  2.4 cm follicle. Left ovary Measurements: 3.5 x 2.4 x 2.8 cm = volume: 13 mL.  2.3 cm follicle. Other findings No abnormal free fluid. IMPRESSION: Unremarkable pelvic ultrasound. Electronically Signed   By: Kennith Center M.D.   On: 02/27/2021 14:28    MDM  Patient is well-appearing 45 year old female presented today with 1 year of pelvic pain.  She has no  abdominal tenderness on my examination.  She is well-appearing has had a reassuring work-up today urinalysis is without any evidence of infection.  CMP, CBC lipase without abnormality.  Wet prep shows some WBCs had no cervical motion tenderness on exam doubt PID.  Symptoms are consistent with this.  Pregnancy test is negative ectopic pregnancy unlikely.  Given lack abdominal tenderness have low suspicion for intra-abdominal infection such as appendicitis or other.  Patient given return precautions discharged home after 1 dose of Toradol.  Pain reasonably well controlled.  She will follow-up with OB/GYN.       Solon Augusta [place], [place] 02/27/21 1550    Benjiman Core, MD 02/27/21 2330

## 2021-02-27 NOTE — ED Notes (Signed)
Patient is being discharged from the Urgent Care and sent to the Emergency Department via POV . Per Roosvelt Maser PA, patient is in need of higher level of care due to Pelvic Pain. Patient is aware and verbalizes understanding of plan of care.  Vitals:   02/27/21 0901  BP: (!) 132/98  Pulse: 76  Resp: 18  Temp: 99 F (37.2 C)  SpO2: 100%

## 2021-02-27 NOTE — Discharge Instructions (Addendum)
You are seen today in the emergency department for pelvic pain.  Your blood work was reassuring, the ultrasound did not show signs of emergent disease.  You can continue taking Zofran every 6 hours as needed for nausea and vomiting.  You may also take Tylenol and Motrin for pain.  I do have an appointment in the middle of July to establish care with a primary care provider.  I am also giving you a referral to an OBG.  Please give them a call as soon as possible to establish an appointment.  If conditions worsen, feel free to return back to the emergency department for further evaluation.   Please use Tylenol or ibuprofen for pain.  You may use 600 mg ibuprofen every 6 hours or 1000 mg of Tylenol every 6 hours.  You may choose to alternate between the 2.  This would be most effective.  Not to exceed 4 g of Tylenol within 24 hours.  Not to exceed 3200 mg ibuprofen 24 hours.

## 2021-02-27 NOTE — ED Provider Notes (Signed)
[facility] EMERGENCY DEPARTMENT Provider Note   CSN: [id number] Arrival date & time: 02/27/21  2355     History No chief complaint on file.   Darlene Bowen is a 45 y.o. female.  HPI  Patient presents with left-sided pelvic pain x1 year.  States the pain is worsened by laying flat, sexual intercourse, pressure.  She states she has not had sex in a year due to the pain.  She does have a history of bladder prolapse.  Additional history of multiple vaginal births.  She has had issues getting a referral to an OBG.  She is denying any fever, vaginal discharge, new sexual partners, dysuria.   States she has a history of constipation as well.  She also has irregular periods, but states this is typical and not new.  Seen in urgent care prior to arrival.  Patient concerned about family history of ovarian cancer.  Past Medical History:  Diagnosis Date   Multiple sclerosis [facility])     Patient Active Problem List   Diagnosis Date Noted   Left flank pain 12/26/2020   Dysphagia 12/26/2020   Low iron 09/05/2020   DVT (deep venous thrombosis) (HCC) 02/15/2020   H/O Bell's palsy 02/15/2020   Vitamin B1 deficiency 02/15/2020   Foot pain 01/05/2018   Tailor's bunion 01/05/2018   Tobacco user 01/05/2018    History reviewed. No pertinent surgical history.   OB History   No obstetric history on file.     Family History  Problem Relation Age of Onset   Cancer Mother     Social History   Tobacco Use   Smoking status: Never   Smokeless tobacco: Never  Vaping Use   Vaping Use: Never used  Substance Use Topics   Alcohol use: Not Currently   Drug use: Yes    Types: Marijuana    Home Medications Prior to Admission medications   Medication Sig Start Date End Date Taking? Authorizing Provider  acetaZOLAMIDE (DIAMOX) 250 MG tablet Take 250 mg by mouth 2 (two) times daily.    [provider]  apixaban (ELIQUIS) 5 MG TABS tablet Eliquis 5 mg tablet Patient  not taking: Reported on 02/27/2021    [provider]  Cholecalciferol 50 MCG (2000 UT) TABS Take by mouth.    [provider]  Crisaborole (EUCRISA) 2 % OINT Apply topically 2 (two) times daily. 07/07/20   [provider]  diclofenac Sodium (VOLTAREN) 1 % GEL diclofenac 1 % topical gel  APPLY 2 GRAMS TO THE AFFECTED AREA(S) BY TOPICAL ROUTE 4 TIMES PER DAY    [provider]  dicyclomine (BENTYL) 20 MG tablet Take 1 tablet (20 mg total) by mouth 2 (two) times daily as needed for spasms. 12/09/20   Linwood Dibbles, MD  ferrous sulfate 325 (65 FE) MG EC tablet Take 1 tablet by mouth daily with breakfast. Patient not taking: Reported on 02/27/2021 02/25/20 02/24/21  [provider]  ibuprofen (ADVIL) 800 MG tablet Take 800 mg by mouth 3 (three) times daily as needed. 09/15/20   [provider]  Melatonin 10 MG TABS Take by mouth.    [provider]  pregabalin (LYRICA) 50 MG capsule Take 50 mg by mouth. Take 50mg  daily in the morning and 100mg  at lunch and at bedtime    [provider]  rizatriptan (MAXALT-MLT) 10 MG disintegrating tablet SMARTSIG:Sublingual 01/19/21   [provider]  TEGRETOL-XR 100 MG 12 hr tablet Take 100 mg by mouth 2 (  two) times daily. Patient not taking: Reported on 02/27/2021 08/04/20   [provider]  valACYclovir HCl (VALTREX PO) valacyclovir    [provider]    Allergies    Codeine, Macrobid [nitrofurantoin], and Penicillins  Review of Systems   Review of Systems  Constitutional:  Negative for fatigue and fever.  Gastrointestinal:  Positive for constipation. Negative for nausea and vomiting.  Genitourinary:  Positive for dyspareunia, frequency, menstrual problem, pelvic pain, urgency, vaginal bleeding and vaginal pain. Negative for decreased urine volume, difficulty urinating, dysuria, flank pain, genital sores, hematuria and vaginal discharge.   Physical Exam Updated Vital  Signs BP (!) 139/95   Pulse 70   Temp 97.8 F (36.6 C) (Oral)   Resp 20   LMP 01/11/2021   SpO2 100%   Physical Exam Vitals and nursing note reviewed. Exam conducted with a chaperone present.  Constitutional:      Appearance: Normal appearance. She is obese.  HENT:     Head: Normocephalic and atraumatic.  Eyes:     General: No scleral icterus.       Right eye: No discharge.        Left eye: No discharge.     Extraocular Movements: Extraocular movements intact.     Pupils: Pupils are equal, round, and reactive to light.  Cardiovascular:     Rate and Rhythm: Normal rate and regular rhythm.     Pulses: Normal pulses.     Heart sounds: Normal heart sounds. No murmur heard.   No friction rub. No gallop.  Pulmonary:     Effort: Pulmonary effort is normal. No respiratory distress.     Breath sounds: Normal breath sounds.  Abdominal:     General: Abdomen is flat. Bowel sounds are normal. There is no distension.     Palpations: Abdomen is soft.     Tenderness: There is no abdominal tenderness.     Hernia: There is no hernia in the right inguinal area.  Genitourinary:    General: Normal vulva.     Exam position: Lithotomy position.     Pubic Area: No rash.      Labia:        Right: No rash or tenderness.        Left: Tenderness present. No rash.      Urethra: Prolapse and urethral pain present.     Rectum: Guaiac result negative.     Comments: Left adnexal tenderness. Skin:    General: Skin is warm and dry.     Coloration: Skin is not jaundiced.  Neurological:     Mental Status: She is alert. Mental status is at baseline.     Coordination: Coordination normal.    ED Results / Procedures / Treatments   Labs (all labs ordered are listed, but only abnormal results are displayed) Labs Reviewed  LIPASE, BLOOD  COMPREHENSIVE METABOLIC PANEL  CBC  URINALYSIS, ROUTINE W REFLEX MICROSCOPIC  I-STAT BETA HCG BLOOD, ED ([facility], [facility], [facility] ONLY)    EKG None  Radiology No results  found.  Procedures Procedures   Medications Ordered in ED Medications - No data to display  ED Course  I have reviewed the triage vital signs and the nursing notes.  Pertinent labs & imaging results that were available during my care of the patient were reviewed by me and considered in my medical decision making (see chart for details).  Clinical Course as of 02/27/21 1520  Tue Feb 27, 2021  1140 I-Stat beta hCG  blood, ED Not consistent with ectopic pregnancy. [HS]  1140 Comprehensive metabolic panel No electrolyte derangement, no elevated liver enzymes, no AKI. [HS]  1140 Lipase, blood Doubt pancreatitis. [HS]  1140 CBC No anemia.  No elevated white count concerning for infectious or inflammatory etiology. [HS]    Clinical Course User Index [HS] Theron Arista, PA-C   MDM Rules/Calculators/A&P                          Patient is a 45 year old female with history of MS presenting with pelvic pain x1 year.  Physical exam showed left adnexal tenderness, also notable for bladder prolapse.  No cervical tenderness on exam.  I doubt PID.  Given the year nature of the pain, I do not suspect ovarian torsion.  However, patient may have ovarian cyst.  Endometriosis is also on the differential.  As her uterine fibroids.  I doubt ectopic pregnancy.  Doubt UTI, but will evaluate for it.  Pain medicine, wet prep, basic labs ordered.  Please see ED course for interpretation of labs.  Patient had a CT abdomen completed 2 months previously.  Was for the same complaint, no changes so do not think there would be additional benefit in repeating the CT.  Lab work-up is reassuring.  I will order ultrasound to assess for ovarian cysts.  I have a suspicion this could be endometriosis, or pain due to prolapse.  There is possibility of uterine fibroids.  Overall, based on today's work-up I do not have high suspicion for emergent pathology.    The ultrasound was negative for ovarian torsion, cyst, abscess.  I  went over the results with the patient, she was agreeable to outpatient follow-up with OBG.  I will discharge her with nausea medicine.   UTI still pending.  Once that results negative patient will be discharged.  I doubt she will have a UTI given lack of urinary symptoms but will confirm.  UTI still pending at the end of my shift. Disposition to follow. Care transferred to [facility] at end of shift.   Final Clinical Impression(s) / ED Diagnoses Final diagnoses:  None    Rx / DC Orders ED Discharge Orders     None        Theron Arista, Cordelia Poche 02/27/21 1530    Pricilla Loveless, MD 02/28/21 [phone]

## 2021-02-28 LAB — GC/CHLAMYDIA PROBE AMP (~~LOC~~) NOT AT ARMC
Chlamydia: NEGATIVE
Comment: NEGATIVE
Comment: NORMAL
Neisseria Gonorrhea: NEGATIVE

## 2021-03-16 ENCOUNTER — Other Ambulatory Visit: Payer: Self-pay

## 2021-03-16 ENCOUNTER — Encounter: Payer: Self-pay | Admitting: Family Medicine

## 2021-03-16 ENCOUNTER — Ambulatory Visit: Payer: Medicaid Other | Admitting: Family Medicine

## 2021-03-16 VITALS — BP 122/82 | HR 68 | Ht 64.0 in | Wt 243.4 lb

## 2021-03-16 DIAGNOSIS — G8929 Other chronic pain: Secondary | ICD-10-CM

## 2021-03-16 DIAGNOSIS — R102 Pelvic and perineal pain: Secondary | ICD-10-CM | POA: Diagnosis not present

## 2021-03-16 DIAGNOSIS — R1319 Other dysphagia: Secondary | ICD-10-CM | POA: Diagnosis not present

## 2021-03-16 DIAGNOSIS — Z8669 Personal history of other diseases of the nervous system and sense organs: Secondary | ICD-10-CM | POA: Diagnosis not present

## 2021-03-16 DIAGNOSIS — G932 Benign intracranial hypertension: Secondary | ICD-10-CM

## 2021-03-16 NOTE — Progress Notes (Signed)
New Patient Office Visit  Subjective:  Patient ID: Darlene Bowen, female    DOB: [birth year]  Age: 45 y.o. MRN: [id number]  CC:  Chief Complaint  Patient presents with   Establish Care    HPI Darlene Bowen presents for establishing care after moving and provider changing practices.   Patient has a history of concern for MS and is being managed by neurology. Does note that she has intermittently slurring speech and has a prior diagnosis of Bell's palsy. Her condition affects her with leg pain (worse on the left), irregular bowel movements, and significant pelvic pain and increased urinary frequency. Also notes weakness (inability to open jars), decreasing vision of her left eye. Has an MRI scheduled soon.  She is not currently sexually active. Quit smoking cigarettes 1 year ago Past Medical History:  Diagnosis Date   Multiple sclerosis (HCC)     No past surgical history on file.  Family History  Problem Relation Age of Onset   Cancer Mother     Social History   Socioeconomic History   Marital status: Single    Spouse name: Not on file   Number of children: Not on file   Years of education: Not on file   Highest education level: Not on file  Occupational History   Not on file  Tobacco Use   Smoking status: Never   Smokeless tobacco: Never  Vaping Use   Vaping Use: Never used  Substance and Sexual Activity   Alcohol use: Not Currently   Drug use: Yes    Types: Marijuana   Sexual activity: Not on file  Other Topics Concern   Not on file  Social History Narrative   Not on file   Social Determinants of Health   Financial Resource Strain: Not on file  Food Insecurity: Not on file  Transportation Needs: Not on file  Physical Activity: Not on file  Stress: Not on file  Social Connections: Not on file  Intimate Partner Violence: Not on file    ROS Review of Systems  Constitutional:  Negative for activity change, fever and unexpected weight change.  HENT:   Positive for sinus pressure and trouble swallowing. Negative for congestion and facial swelling.   Eyes:  Positive for visual disturbance.  Respiratory:  Negative for cough, chest tightness and shortness of breath.   Cardiovascular:  Negative for chest pain and palpitations.  Gastrointestinal:  Negative for nausea and vomiting.  Genitourinary:  Positive for frequency and pelvic pain. Negative for difficulty urinating and vaginal bleeding.  Neurological:  Positive for speech difficulty and weakness. Negative for tremors and light-headedness.   Objective:   Today's Vitals: BP 122/82   Pulse 68   Ht 5\' 4"  (1.626 m)   Wt 243 lb 6 oz (110.4 kg)   LMP 01/28/2021   BMI 41.78 kg/m   Physical Exam Constitutional:      Appearance: She is obese.  HENT:     Head: Normocephalic and atraumatic.     Right Ear: Tympanic membrane normal.     Left Ear: Tympanic membrane normal.     Nose: Nose normal.     Mouth/Throat:     Mouth: Mucous membranes are moist.     Pharynx: Oropharynx is clear.  Eyes:     Extraocular Movements: Extraocular movements intact.     Conjunctiva/sclera: Conjunctivae normal.     Pupils: Pupils are equal, round, and reactive to light.  Cardiovascular:     Rate and Rhythm: Normal rate  and regular rhythm.     Heart sounds: Normal heart sounds.  Pulmonary:     Effort: Pulmonary effort is normal.     Breath sounds: Normal breath sounds.  Abdominal:     General: Abdomen is flat. Bowel sounds are normal.     Palpations: Abdomen is soft.     Comments: Tender to palpation in the left upper and lower quadrants as well as suprapubic area.  No rebound tenderness, no involuntary guarding.  Musculoskeletal:     Cervical back: Normal range of motion and neck supple.  Skin:    General: Skin is warm and dry.  Neurological:     General: No focal deficit present.     Mental Status: She is alert and oriented to person, place, and time.  Psychiatric:        Mood and Affect: Mood  normal.        Behavior: Behavior normal.    Assessment & Plan:  Darlene Bowen is a 45 year old female with a history of left-sided Bell's palsy in 2017, DVT, thiamine deficiency, iron deficiency, migraines presenting for establishment of care.  Of note, patient is not taking all the medications that are on her medication list but she does not have them with her.  She was advised to bring them back at her next visit so we can do a full med reconciliation.  Also awaiting notes and documentation from prior PCP as they are not within our system  Chronic pelvic pain: Patient reports chronic pelvic pain for at least 1 year with some tenderness upon examination.  Previously seen on 02/27/2021 in the ED with little concerns for PID at that time and no signs of infection.  Patient requests OB/GYN referral. - OB/GYN referral placed  Concern for MS: Patient is currently being followed by [facility] neurology.  Most recently seen on 01/04/2021.  Was noted to have lumbar puncture with elevated opening pressure.  CSF studies negative for signs of MS.  MRIs have not yet been completed, and are to occur soon.  Is currently on Lyrica daily.  Per neurology note, it is not felt to have MS at this time but also awaiting follow-up imaging.  Elevated opening pressure on LP Currently on acetazolamide 500 mg twice daily.  Following with neuro-ophthalmology.  Concern for history of migraine headaches and has rizatriptan as a prescription.  Patient will benefit from weight loss to reduce pressure.  History of iron deficiency Patient was previously on oral supplementation but could not tolerate due to constipation.  Last CBC in epic documentation was on 12/09/2020 with a hemoglobin of 13.6, last ferritin level on 05/26/2020 of 10.  May need to consider IV iron infusions if patient is unable to tolerate oral supplementation - Will need repeat CBC and ferritin level at next visit. - Follow-up results and notes from prior  PCP  Blurred vision, worse on the left Followed by neuro-ophthalmology.  Esophageal dysphagia Patient has recently seen GI on 12/26/2020.  CTAP in April was unremarkable.  Plan for a barium swallow and eventual EGD with a colonoscopy. - We will follow-up GI recommendations after barium swallow   Outpatient Encounter Medications as of 03/16/2021  Medication Sig   acetaZOLAMIDE (DIAMOX) 250 MG tablet Take 250 mg by mouth 2 (two) times daily.   pregabalin (LYRICA) 50 MG capsule Take 50 mg by mouth. Take 50mg  daily in the morning and 100mg  at lunch and at bedtime   apixaban (ELIQUIS) 5 MG TABS tablet Eliquis 5 mg  tablet (Patient not taking: Reported on 02/27/2021)   Cholecalciferol 50 MCG (2000 UT) TABS Take by mouth.   Crisaborole (EUCRISA) 2 % OINT Apply topically 2 (two) times daily.   diclofenac Sodium (VOLTAREN) 1 % GEL diclofenac 1 % topical gel  APPLY 2 GRAMS TO THE AFFECTED AREA(S) BY TOPICAL ROUTE 4 TIMES PER DAY   dicyclomine (BENTYL) 20 MG tablet Take 1 tablet (20 mg total) by mouth 2 (two) times daily as needed for spasms.   ferrous sulfate 325 (65 FE) MG EC tablet Take 1 tablet by mouth daily with breakfast. (Patient not taking: Reported on 02/27/2021)   ibuprofen (ADVIL) 800 MG tablet Take 800 mg by mouth 3 (three) times daily as needed.   Melatonin 10 MG TABS Take by mouth.   ondansetron (ZOFRAN) 4 MG tablet Take 1 tablet (4 mg total) by mouth every 6 (six) hours.   rizatriptan (MAXALT-MLT) 10 MG disintegrating tablet SMARTSIG:Sublingual   TEGRETOL-XR 100 MG 12 hr tablet Take 100 mg by mouth 2 (two) times daily. (Patient not taking: Reported on 02/27/2021)   valACYclovir HCl (VALTREX PO) valacyclovir   No facility-administered encounter medications on file as of 03/16/2021.    Follow-up: Return in about 4 weeks (around 04/13/2021) for Medications and f/u.   Alton Bouknight, DO

## 2021-03-16 NOTE — Patient Instructions (Addendum)
Please bring your medications to your next visit with you. We are hoping to get your records from your prior primary doctor and will be able to talk a bit further about some of your conditions. We will get the labs and other records from them and will follow-up in the next several weeks.  We will go through further physical exam and history at that time, once I am able to gather all the information.  I sent in a referral to OB/GYN for your chronic pelvic pain.  If you need anything further please do not hesitate call the office at [phone].

## 2021-03-17 ENCOUNTER — Encounter: Payer: Self-pay | Admitting: Family Medicine

## 2021-03-23 DIAGNOSIS — M5124 Other intervertebral disc displacement, thoracic region: Secondary | ICD-10-CM | POA: Diagnosis not present

## 2021-03-23 DIAGNOSIS — J9811 Atelectasis: Secondary | ICD-10-CM | POA: Diagnosis not present

## 2021-03-23 DIAGNOSIS — R9089 Other abnormal findings on diagnostic imaging of central nervous system: Secondary | ICD-10-CM | POA: Diagnosis not present

## 2021-03-23 DIAGNOSIS — M6281 Muscle weakness (generalized): Secondary | ICD-10-CM | POA: Diagnosis not present

## 2021-03-23 DIAGNOSIS — G35 Multiple sclerosis: Secondary | ICD-10-CM | POA: Diagnosis not present

## 2021-03-23 DIAGNOSIS — R202 Paresthesia of skin: Secondary | ICD-10-CM | POA: Diagnosis not present

## 2021-04-30 ENCOUNTER — Other Ambulatory Visit (HOSPITAL_COMMUNITY)
Admission: RE | Admit: 2021-04-30 | Discharge: 2021-04-30 | Disposition: A | Discharge status: Home / Self Care | Payer: Medicaid Other | Source: Ambulatory Visit | Attending: Obstetrics and Gynecology | Admitting: Obstetrics and Gynecology

## 2021-04-30 ENCOUNTER — Encounter: Payer: Self-pay | Admitting: Obstetrics and Gynecology

## 2021-04-30 ENCOUNTER — Ambulatory Visit (INDEPENDENT_AMBULATORY_CARE_PROVIDER_SITE_OTHER): Payer: Medicaid Other | Admitting: Obstetrics and Gynecology

## 2021-04-30 ENCOUNTER — Other Ambulatory Visit: Payer: Self-pay

## 2021-04-30 VITALS — BP 104/73 | HR 80 | Ht 64.0 in | Wt 250.0 lb

## 2021-04-30 DIAGNOSIS — R32 Unspecified urinary incontinence: Secondary | ICD-10-CM

## 2021-04-30 DIAGNOSIS — Z01419 Encounter for gynecological examination (general) (routine) without abnormal findings: Secondary | ICD-10-CM

## 2021-04-30 NOTE — Progress Notes (Signed)
NGYN patient presents for visit to establish care.  LMP: irregular per pt 04/09/21 before that had not had cycle x 3 months. Contraception : None  Last pap: per pt x 1 yr now.  Mammogram: 05/09/2020 was mammogram then 12/07/20 was F/U of Right breast .  STD Screening: None  Family Hx of Breast Cancer:  CC: Pelvic Pain per pt has been an issues x 1 yr now pt states she was not able to be seen due to other health concerns.  Pain is 8/10x when she has pain , not sexually active x 2 yrs,

## 2021-04-30 NOTE — Progress Notes (Signed)
Subjective:     Darlene Bowen is a 45 y.o. female with BMI 42 presenting today for the evaluation of a 1-year history of pelvic pain. Patient reports pain is associated with her menstrual cycle. She reports menses every 3-4 months over the past year. Her periods last 7 days and are heavy in flow the first 2-3 days. She reports a lot of bloating and LLQ pain during those initial days. She has not taken any medication for it. Patient also reports urinary incontinence. She often wets the bed at night and experiences urgency during the day. She has limited her fluid intake without improvement. Patient is currently being evaluated for possible MS. Patient is not sexually active. She denies any other complaints. She denies any abnormal discharge  Past Medical History:  Diagnosis Date   Multiple sclerosis (HCC)    History reviewed. No pertinent surgical history. Family History  Problem Relation Age of Onset   Cancer Mother     Social History   Socioeconomic History   Marital status: Single    Spouse name: Not on file   Number of children: Not on file   Years of education: Not on file   Highest education level: Not on file  Occupational History   Not on file  Tobacco Use   Smoking status: Never   Smokeless tobacco: Never  Vaping Use   Vaping Use: Never used  Substance and Sexual Activity   Alcohol use: Not Currently   Drug use: Yes    Types: Marijuana   Sexual activity: Not on file  Other Topics Concern   Not on file  Social History Narrative   Not on file   Social Determinants of Health   Financial Resource Strain: Not on file  Food Insecurity: Not on file  Transportation Needs: Not on file  Physical Activity: Not on file  Stress: Not on file  Social Connections: Not on file  Intimate Partner Violence: Not on file   Health Maintenance  Topic Date Due   HIV Screening  Never done   Hepatitis C Screening  Never done   TETANUS/TDAP  Never done   PAP SMEAR-Modifier  Never  done   COVID-19 Vaccine (3 - Booster for Pfizer series) 10/28/2020   COLONOSCOPY (Pts 45-40yrs Insurance coverage will need to be confirmed)  Never done   INFLUENZA VACCINE  04/02/2021   Pneumococcal Vaccine 54-13 Years old  Aged Out   HPV VACCINES  Aged Out       Review of Systems Pertinent items noted in HPI and remainder of comprehensive ROS otherwise negative.   Objective:  Blood pressure 104/73, pulse 80, height 5\' 4"  (1.626 m), weight 250 lb (113.4 kg).   GENERAL: Well-developed, well-nourished female in no acute distress.  HEENT: Normocephalic, atraumatic. Sclerae anicteric.  NECK: Supple. Normal thyroid.  LUNGS: Clear to auscultation bilaterally.  HEART: Regular rate and rhythm. BREASTS: Symmetric in size. No palpable masses or lymphadenopathy, skin changes, or nipple drainage. ABDOMEN: Soft, nontender, nondistended. No organomegaly. PELVIC: Normal external female genitalia. Vagina is pink and rugated.  Normal discharge. Normal appearing cervix. Uterus is normal in size. No adnexal mass or tenderness. Chaperone present during the pelvic exam EXTREMITIES: No cyanosis, clubbing, or edema, 2+ distal pulses.     PELVIC COMPLETE WITH TRANSVAGINAL  Result Date: 02/27/2021 CLINICAL DATA:  Concern for left ovarian cyst. EXAM: TRANSABDOMINAL AND TRANSVAGINAL ULTRASOUND OF PELVIS TECHNIQUE: Both transabdominal and transvaginal ultrasound examinations of the pelvis were performed. Transabdominal technique was performed  for global imaging of the pelvis including uterus, ovaries, adnexal regions, and pelvic cul-de-sac. It was necessary to proceed with endovaginal exam following the transabdominal exam to visualize the endometrium. COMPARISON:  CT scan 12/09/2020 FINDINGS: Uterus Measurements: 12.6 x 5.7 x 6.4 cm = volume: To 137 mL. No fibroids or other mass visualized. Endometrium Thickness: 10 mm.  No focal abnormality visualized. Right ovary Measurements: 2.7 x 2.6 x 3.6 cm = volume: 14  mL.  2.4 cm follicle. Left ovary Measurements: 3.5 x 2.4 x 2.8 cm = volume: 13 mL.  2.3 cm follicle. Other findings No abnormal free fluid. IMPRESSION: Unremarkable pelvic ultrasound. Electronically Signed   By: Kennith Center M.D.   On: 02/27/2021 14:28    Assessment:    Healthy female exam.      Plan:    Pap smear collected Patient had screening mammogram in April 2022 Urine culture collected Patient referred to uro gyn Patient will be contacted with abnormal results Advised to start taking ibuprofen for dysmenorrhea and to keep a menstrual cycle See After Visit Summary for Counseling Recommendations

## 2021-05-02 LAB — CYTOLOGY - PAP
Adequacy: ABSENT
Comment: NEGATIVE
Diagnosis: NEGATIVE
High risk HPV: NEGATIVE

## 2021-05-03 LAB — URINE CULTURE

## 2021-06-28 NOTE — Progress Notes (Deleted)
[facility] New Patient Evaluation and Consultation  Referring Provider: Catalina Antigua, MD PCP: Evelena Leyden, DO Date of Service: 06/29/2021  SUBJECTIVE Chief Complaint: No chief complaint on file.  History of Present Illness: Darlene Bowen is a 45 y.o. {ED SANE [phone] female seen in consultation at the request of Dr. Jolayne Panther for evaluation of incontinence.    Review of records from Dr Jolayne Panther significant for: Has urgency during the day and wets the bed at night  Urinary Symptoms: {urine leakage?:24754} Leaks *** time(s) per {days/wks/mos/yrs:310907}.  Pad use: {NUMBERS 1-10:18281} {pad option:24752} per day.   She {ACTION; IS/IS LOV:56433295} bothered by her UI symptoms.  Day time voids ***.  Nocturia: *** times per night to void. Voiding dysfunction: she {empties:24755} her bladder well.  {DOES NOT does:27190::"does not"} use a catheter to empty bladder.  When urinating, she feels {urine symptoms:24756} Drinks: *** per day  UTIs: {NUMBERS 1-10:18281} UTI's in the last year.   {ACTIONS;DENIES/REPORTS:21021675::"Denies"} history of {urologic concerns:24757}  Pelvic Organ Prolapse Symptoms:                  She {denies/ admits to:24761} a feeling of a bulge the vaginal area. It has been present for {NUMBER 1-10:22536} {days/wks/mos/yrs:310907}.  She {denies/ admits to:24761} seeing a bulge.  This bulge {ACTION; IS/IS JOA:41660630} bothersome.  Bowel Symptom: Bowel movements: *** time(s) per {Time; day/week/month:13537} Stool consistency: {stool consistency:24758} Straining: {yes/no:19897}.  Splinting: {yes/no:19897}.  Incomplete evacuation: {yes/no:19897}.  She {denies/ admits to:24761} accidental bowel leakage / fecal incontinence  Occurs: *** time(s) per {Time; day/week/month:13537}  Consistency with leakage: {stool consistency:24758} Bowel regimen: {bowel regimen:24759} Last colonoscopy: Date ***, Results ***  Sexual Function Sexually active:  {yes/no:19897}.  Sexual orientation: {Sexual Orientation:[phone]} Pain with sex: {pain with sex:24762}  Pelvic Pain {denies/ admits to:24761} pelvic pain Location: *** Pain occurs: *** Prior pain treatment: *** Improved by: *** Worsened by: ***   Past Medical History:  Past Medical History:  Diagnosis Date   Multiple sclerosis (HCC)      Past Surgical History:  No past surgical history on file.   Past OB/GYN History: G{NUMBERS 1-10:18281} P{NUMBERS 1-10:18281} Vaginal deliveries: ***,  Forceps/ Vacuum deliveries: ***, Cesarean section: *** Menopausal: {menopausal:24763} Contraception: ***. Last pap smear was ***.  Any history of abnormal pap smears: {yes/no:19897}.   Medications: She has a current medication list which includes the following prescription(s): acetazolamide, cholecalciferol, eucrisa, diclofenac sodium, dicyclomine, ibuprofen, ondansetron, pregabalin, rizatriptan, and valacyclovir hcl.   Allergies: Patient is allergic to codeine, macrobid [nitrofurantoin], and penicillins.   Social History:  Social History   Tobacco Use   Smoking status: Never   Smokeless tobacco: Never  Vaping Use   Vaping Use: Never used  Substance Use Topics   Alcohol use: Not Currently   Drug use: Yes    Types: Marijuana    Relationship status: {relationship status:24764} She lives with ***.   She {ACTION; IS/IS ZSW:10932355} employed ***. Regular exercise: {Yes/No:304960894} History of abuse: {Yes/No:304960894}  Family History:  No family history on file.   Review of Systems: ROS   OBJECTIVE Physical Exam: There were no vitals filed for this visit.  Physical Exam   GU / Detailed Urogynecologic Evaluation:  Pelvic Exam: Normal external female genitalia; Bartholin's and Skene's glands normal in appearance; urethral meatus normal in appearance, no urethral masses or discharge.   CST: {gen negative/positive:315881}  Reflexes: bulbocavernosis {DESC; PRESENT/NOT  PRESENT:21021351}, anocutaneous {DESC; PRESENT/NOT PRESENT:21021351} ***bilaterally.  Speculum exam reveals normal vaginal mucosa {With/Without:20273} atrophy. Cervix {exam; gyn cervix:30847}.  Uterus {exam; pelvic uterus:30849}. Adnexa {exam; adnexa:12223}.    s/p hysterectomy: Speculum exam reveals normal vaginal mucosa {With/Without:20273}  atrophy and normal vaginal cuff.  Adnexa {exam; adnexa:12223}.    With apex supported, anterior compartment defect was {reduced:24765}  Pelvic floor strength {Roman # I-V:19040}/V, puborectalis {Roman # I-V:19040}/V external anal sphincter {Roman # I-V:19040}/V  Pelvic floor musculature: Right levator {Tender/Non-tender:20250}, Right obturator {Tender/Non-tender:20250}, Left levator {Tender/Non-tender:20250}, Left obturator {Tender/Non-tender:20250}  POP-Q:   POP-Q                                               Aa                                               Ba                                                 C                                                Gh                                               Pb                                               tvl                                                Ap                                               Bp                                                 D     Rectal Exam:  Normal sphincter tone, {rectocele:24766} distal rectocele, enterocoele {DESC; PRESENT/NOT PRESENT:21021351}, no rectal masses, {sign of:24767} dyssynergia when asking the patient to bear down.  Post-Void Residual (PVR) by Bladder Scan: In order to evaluate bladder emptying, we discussed obtaining a postvoid residual and she agreed to this procedure.  Procedure: The ultrasound unit was placed on the patient's abdomen in the suprapubic region after the patient had voided. A PVR of *** ml was obtained by bladder scan.  Laboratory Results: @ENCLABS @   ***I visualized  the urine specimen, noting the specimen to be  {urine color:24768}  ASSESSMENT AND PLAN Darlene Bowen is a 45 y.o. with: No diagnosis found.    Marguerita Beards, MD   Medical Decision Making:  - Reviewed/ ordered a clinical laboratory test - Reviewed/ ordered a radiologic study - Reviewed/ ordered medicine test - Decision to obtain old records - Discussion of management of or test interpretation with an external physician / other healthcare professional  - Assessment requiring independent historian - Review and summation of prior records - Independent review of image, tracing or specimen

## 2021-06-29 ENCOUNTER — Ambulatory Visit: Payer: Medicaid Other | Admitting: Obstetrics and Gynecology

## 2021-07-08 ENCOUNTER — Encounter: Payer: Self-pay | Admitting: Family Medicine

## 2021-07-11 ENCOUNTER — Encounter: Payer: Self-pay | Admitting: Family Medicine

## 2021-07-11 DIAGNOSIS — G2581 Restless legs syndrome: Secondary | ICD-10-CM | POA: Diagnosis not present

## 2021-07-11 DIAGNOSIS — G932 Benign intracranial hypertension: Secondary | ICD-10-CM | POA: Diagnosis not present

## 2021-07-11 DIAGNOSIS — M6281 Muscle weakness (generalized): Secondary | ICD-10-CM | POA: Diagnosis not present

## 2021-07-11 DIAGNOSIS — R790 Abnormal level of blood mineral: Secondary | ICD-10-CM | POA: Diagnosis not present

## 2021-07-11 DIAGNOSIS — R531 Weakness: Secondary | ICD-10-CM | POA: Diagnosis not present

## 2021-07-11 DIAGNOSIS — M2550 Pain in unspecified joint: Secondary | ICD-10-CM | POA: Diagnosis not present

## 2021-07-11 DIAGNOSIS — E519 Thiamine deficiency, unspecified: Secondary | ICD-10-CM | POA: Diagnosis not present

## 2021-07-11 DIAGNOSIS — R202 Paresthesia of skin: Secondary | ICD-10-CM | POA: Diagnosis not present

## 2021-07-11 DIAGNOSIS — R4189 Other symptoms and signs involving cognitive functions and awareness: Secondary | ICD-10-CM | POA: Diagnosis not present

## 2021-07-11 MED ORDER — EUCRISA 2 % EX OINT: 1.0000 "application " | TOPICAL_OINTMENT | Freq: Two times a day (BID) | CUTANEOUS | 1 refills | Status: DC
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
## 2021-07-31 ENCOUNTER — Other Ambulatory Visit: Payer: Self-pay

## 2021-07-31 ENCOUNTER — Encounter: Payer: Self-pay | Admitting: Obstetrics and Gynecology

## 2021-07-31 ENCOUNTER — Ambulatory Visit (INDEPENDENT_AMBULATORY_CARE_PROVIDER_SITE_OTHER): Payer: Medicaid Other | Admitting: Obstetrics and Gynecology

## 2021-07-31 VITALS — BP 121/88 | HR 72 | Ht 64.0 in | Wt 248.0 lb

## 2021-07-31 DIAGNOSIS — M62838 Other muscle spasm: Secondary | ICD-10-CM

## 2021-07-31 DIAGNOSIS — N393 Stress incontinence (female) (male): Secondary | ICD-10-CM

## 2021-07-31 DIAGNOSIS — N3281 Overactive bladder: Secondary | ICD-10-CM

## 2021-07-31 MED ORDER — CYCLOBENZAPRINE HCL 5 MG PO TABS: 5.0000 mg | ORAL_TABLET | Freq: Three times a day (TID) | ORAL | 5 refills | Status: DC | PRN

## 2021-07-31 MED ORDER — MIRABEGRON ER 25 MG PO TB24
25.0000 mg | ORAL_TABLET | Freq: Every day | ORAL | 5 refills | Status: AC
Start: 2021-07-31 — End: ?

## 2021-07-31 NOTE — Patient Instructions (Signed)
Take Flexeril 5mg  up to 3 times a day- start at night to see if it causes sedation.

## 2021-08-07 ENCOUNTER — Encounter: Payer: Self-pay | Admitting: Family Medicine

## 2021-08-07 ENCOUNTER — Ambulatory Visit (INDEPENDENT_AMBULATORY_CARE_PROVIDER_SITE_OTHER): Payer: Medicaid Other | Admitting: Family Medicine

## 2021-08-07 ENCOUNTER — Other Ambulatory Visit: Payer: Self-pay

## 2021-08-07 VITALS — BP 125/109 | HR 81 | Ht 64.0 in | Wt 250.8 lb

## 2021-08-07 DIAGNOSIS — A6000 Herpesviral infection of urogenital system, unspecified: Secondary | ICD-10-CM | POA: Insufficient documentation

## 2021-08-07 DIAGNOSIS — H538 Other visual disturbances: Secondary | ICD-10-CM | POA: Diagnosis not present

## 2021-08-07 DIAGNOSIS — Z6841 Body Mass Index (BMI) 40.0 and over, adult: Secondary | ICD-10-CM

## 2021-08-07 DIAGNOSIS — H539 Unspecified visual disturbance: Secondary | ICD-10-CM | POA: Diagnosis not present

## 2021-08-07 DIAGNOSIS — L308 Other specified dermatitis: Secondary | ICD-10-CM

## 2021-08-07 DIAGNOSIS — L309 Dermatitis, unspecified: Secondary | ICD-10-CM | POA: Insufficient documentation

## 2021-08-07 MED ORDER — ALCLOMETASONE DIPROPIONATE 0.05 % EX OINT: TOPICAL_OINTMENT | Freq: Two times a day (BID) | CUTANEOUS | 0 refills | Status: AC

## 2021-08-07 MED ORDER — VALACYCLOVIR HCL 1 G PO TABS: 1000.0000 mg | ORAL_TABLET | Freq: Every day | ORAL | 3 refills | Status: DC

## 2021-08-07 MED ORDER — EUCRISA 2 % EX OINT: 1.0000 "application " | TOPICAL_OINTMENT | Freq: Every day | CUTANEOUS | 2 refills | Status: AC | PRN

## 2021-08-07 NOTE — Assessment & Plan Note (Addendum)
BMI 43.05.  Patient does have chronic back pain that could be worsened by her weight.  Patient agreeable to nutrition/medical weight management. -Referral placed to medical weight management

## 2021-08-07 NOTE — Assessment & Plan Note (Signed)
Recurrent outbreaks with 2 to 3/month most recently, was on outbreak treatment dosing but will transition to prophylactic dosing. - Valacyclovir 1 g daily

## 2021-08-07 NOTE — Assessment & Plan Note (Signed)
Patient requesting refill of her previous eczema medications.  Still awaiting records from previous physician but will fill them now. - Prescribed Eucrisa 2% - Prescribed alclometasone 0.05%

## 2021-08-07 NOTE — Patient Instructions (Signed)
I sent in the medications for your eczema as requested.  I have also sent in for prophylactic dosing of valacyclovir.  If you start having an outbreak before you begin taking this medication then take the treatment doses that you are previously prescribed and then start this.  The new medication will be 1 g once daily they will continue every day.  I have put in a referral for medical weight management, they should be calling you.  If there is any issue with weight times let me know and we can see if there are other options.

## 2021-08-07 NOTE — Progress Notes (Addendum)
SUBJECTIVE:   CHIEF COMPLAINT / HPI:   Left sided pelvic pain Patient reports that her chronic left-sided pelvic pain travels into her leg and has been getting worse.  She is seeing urogynecology for this and was diagnosed with levator ani spasm.  Patient is currently on Flexeril for the pain, which has improved somewhat but it is still present.  She is also on Myrbetriq for urinary incontinence, which has been very helpful.  Patient reports that she still having a painful pulling sensation in her left pelvic area.  This pain has been present for 3 years and is just acutely worsening, it is why she stopped having intercourse. Patient also notes that the pain is complicated by history of HSV with recurrent outbreaks, currently on outbreak dosing with valacyclovir.  She previously had only a few outbreaks a year and is now having 2 to 3/month. She is agreeable to switching to prophylactic dosing for valacyclovir  Patient is still following with neurology and i had pregabalin was increased to 100 mg 3 times daily.  She was also given a referral for physical therapy and encouraged to follow-up with neuropsych testing (scheduled for 12/2021) per neurology documentation on 07/11/2021  Patient reports that she has been told to follow-up with rheumatology but that appointment will not be for a few months due to availability.  Patient would like to address possible weight loss as she thinks it may help with her back pain.  She has not talked with a nutritionist before and does not have diabetes or known kidney disease.  Patient also has a history of eczema and is needing to get her home medications of Eucrisa and alclometasone filled for this.  Patient also reports that she has been having an increased level of anxiety lately and she is unsure she is also having depression as well with it.  She reports that her diagnoses and her difficulty with ambulation have made her feel like an old woman.  She is not  sure what to do at this point and is wanting to make it better.   PERTINENT  PMH / PSH: Reviewed  OBJECTIVE:   BP (!) 125/109   Pulse 81   Ht 5\' 4"  (1.626 m)   Wt 250 lb 12.8 oz (113.8 kg)   LMP 07/27/2021   SpO2 100%   BMI 43.05 kg/m   General: NAD, well-appearing, well-nourished Respiratory: No respiratory distress, breathing comfortably, able to speak in full sentences Skin: warm and dry, no rashes noted on exposed skin Psych: Appropriate affect and mood MSK: left pelvic region along the inguinal fold exquisitely tender to palpation, pain radiates down her leg and is present with every movement for ROM testing.  ASSESSMENT/PLAN:   Eczema Patient requesting refill of her previous eczema medications.  Still awaiting records from previous physician but will fill them now. - Prescribed Eucrisa 2% - Prescribed alclometasone 0.05%  Class 3 severe obesity due to excess calories with body mass index (BMI) of 40.0 to 44.9 in adult (HCC) BMI 43.05.  Patient does have chronic back pain that could be worsened by her weight.  Patient agreeable to nutrition/medical weight management. -Referral placed to medical weight management  Recurrent genital HSV (herpes simplex virus) infection Recurrent outbreaks with 2 to 3/month most recently, was on outbreak treatment dosing but will transition to prophylactic dosing. - Valacyclovir 1 g daily   Left eye blurriness  history of cataracts Patient reports she has previously been seen by neuro-ophthalmology and was  diagnosed with cataracts.  Do not have the records currently but patient requesting a referral to ophthalmology. - Referral to ophthalmology provided  Chronic pelvic pain Patient is being evaluated by urogynecology.  Diagnosed with levator ani spasm and prescribed Flexeril.  Is having some minor improvement with her medications but exam with exquisite tenderness to ROM and palpation.  Further encouraged patient to follow-up with pelvic  floor rehabilitation.  Per referral documentation appears that a voicemail was left on the patient's phone, advised patient to check back for her voicemails.  Elevated PHQ-9, concern for depression anxiety Patient's PHQ-9 score was 19, her symptoms make it very difficult for her most days.  She does have a large element of anxiety.  We were unable to address further at this visit due to her other extensive problems being discussed.  Patient would likely benefit from counseling resources and possible medications to help relieve her symptoms.   Evelena Leyden, DO [facility]

## 2021-08-10 ENCOUNTER — Ambulatory Visit: Payer: Medicaid Other | Admitting: Obstetrics and Gynecology

## 2021-09-02 IMAGING — DX DG ABDOMEN 1V
1 series · 1 of 1 positions shown · non-contrast
Comparison: None.

CLINICAL DATA: Left lower pelvic pain wrapping around back for 2
months, increasing

EXAM:
ABDOMEN - 1 VIEW

[abdomen kub]
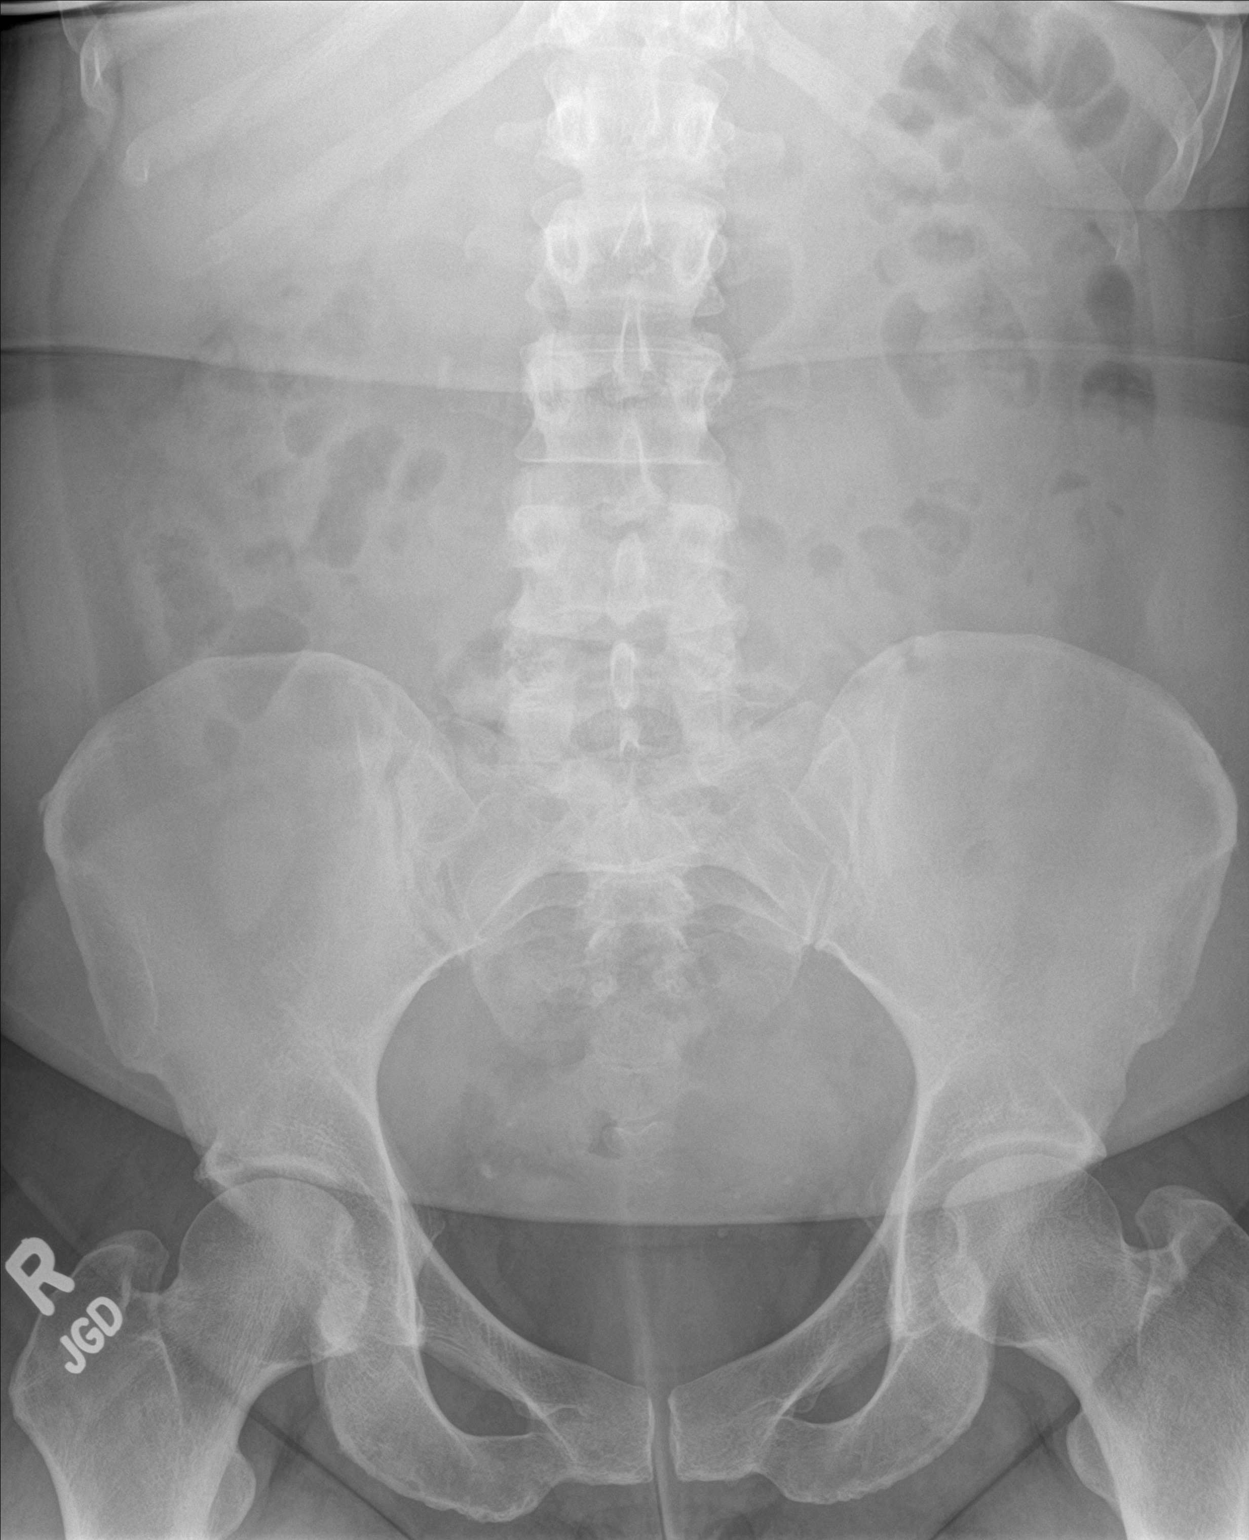

[1 of 1 positions shown; findings below may reference images not displayed]

FINDINGS: Few punctate radiodensities in the pelvis have an appearance most
suggestive of small phleboliths. No other suspicious abdominal
calcifications over the course of the renal shadows or urinary
tract. No high-grade obstructive bowel gas pattern. No other acute
osseous or soft tissue abnormality.
IMPRESSION: No acute radiographic abnormality of the included abdomen and
pelvis.

Punctate calcifications in the pelvis have an appearance most
suggestive of phleboliths.

## 2021-09-06 NOTE — Progress Notes (Unsigned)
Submitted PA for Myrbetriq 25mg  on Cover my Meds. Key  PA Case ID: Fuller Mandril    Status: Approved Prior Auth: Coverage Start Date:09/06/2021; Coverage End Date:09/06/2022

## 2021-09-10 ENCOUNTER — Other Ambulatory Visit (HOSPITAL_COMMUNITY): Payer: Self-pay

## 2021-09-10 ENCOUNTER — Telehealth: Payer: Self-pay

## 2021-09-10 NOTE — Telephone Encounter (Signed)
A Prior Authorization was initiated for this patients EUCRISA 2% OINTMENT through CoverMyMeds.   Key: [id number]

## 2021-09-10 NOTE — Telephone Encounter (Signed)
Prior Auth for patients medication EUCRISA 2% CREAM approved by [employer] from 09/10/21 to 09/10/22.  Key: [id number]  Patients pharmacy notified, copay $4

## 2021-09-13 IMAGING — CT CT ABD-PELV W/ CM
2 of 5 series · 17 of 46 positions shown, 19 images · IV contrast (Omni 300)
Comparison: None.

CLINICAL DATA: 44-year-old female with acute LEFT-sided abdominal
and pelvic pain.

EXAM:
CT ABDOMEN AND PELVIS WITH CONTRAST
TECHNIQUE: Multidetector CT imaging of the abdomen and pelvis was performed
using the standard protocol following bolus administration of
intravenous contrast.
CONTRAST:  100mL OMNIPAQUE IOHEXOL 300 MG/ML  SOLN

[Series 3: a/p w/ 5mm · axial · 0.78mm/px · z∈[+725,+1140]mm · 14 of 95 slices shown, 16 images]
[im 6/95  soft-tissue]
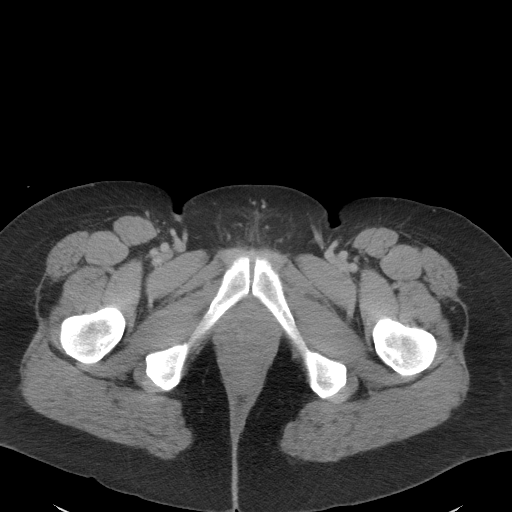
[im 6/95  bone]
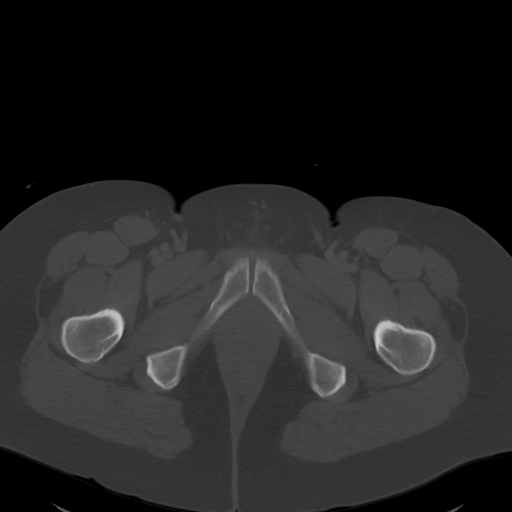
[im 11/95  soft-tissue]
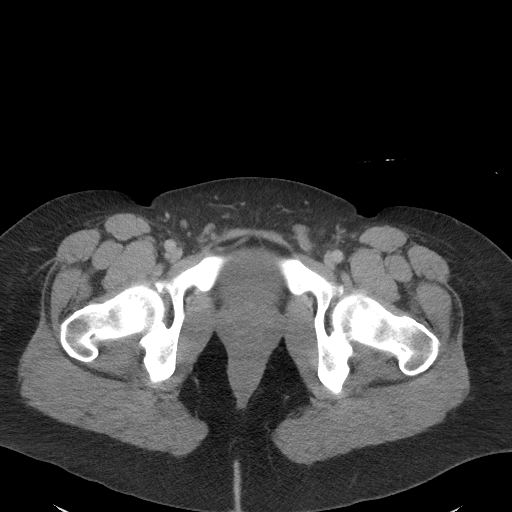
[im 21/95  soft-tissue]
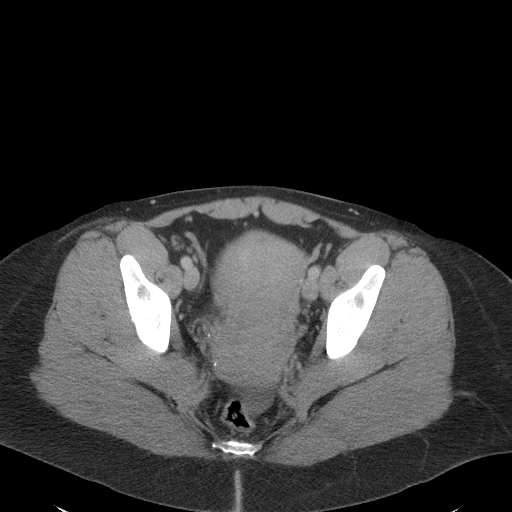
[im 27/95  soft-tissue]
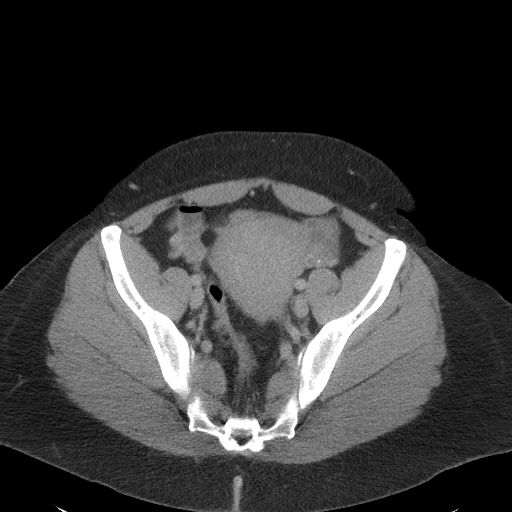
[im 32/95  soft-tissue]
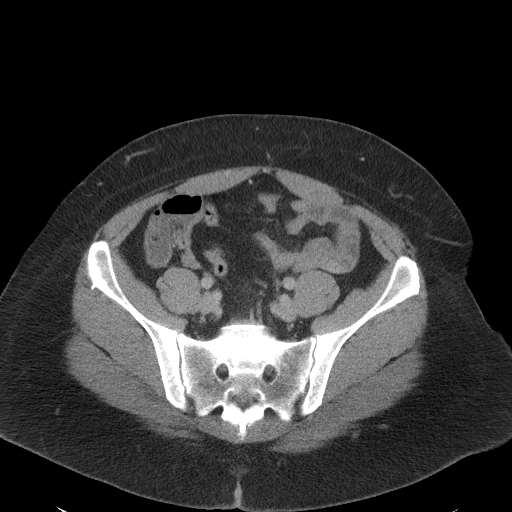
[im 37/95  soft-tissue]
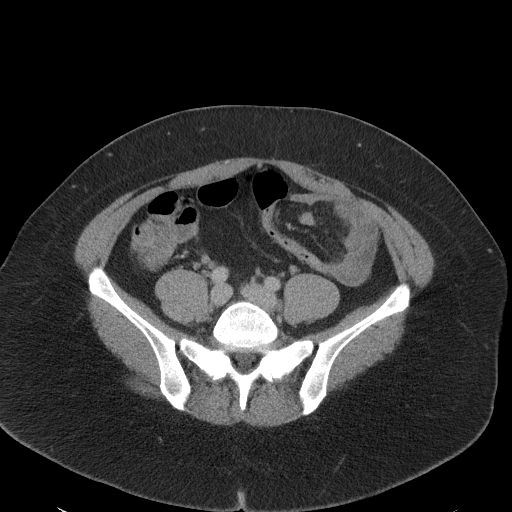
[im 42/95  soft-tissue]
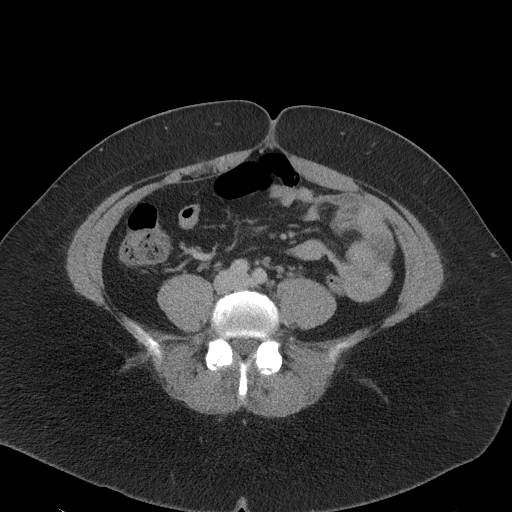
[im 53/95  soft-tissue]
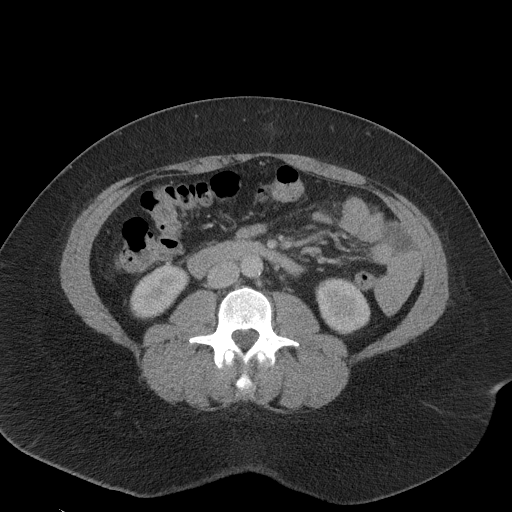
[im 58/95  soft-tissue]
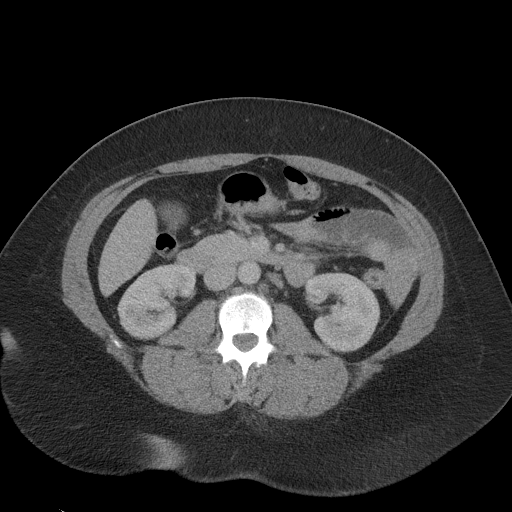
[im 58/95  bone]
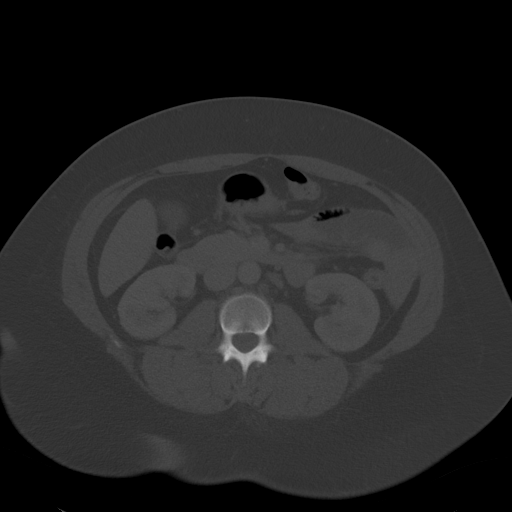
[im 63/95  soft-tissue]
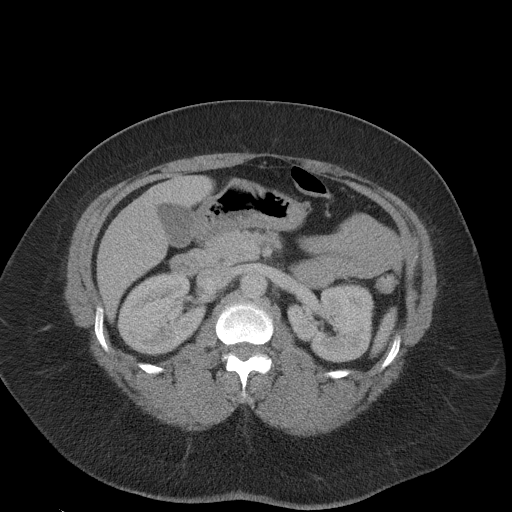
[im 68/95  soft-tissue]
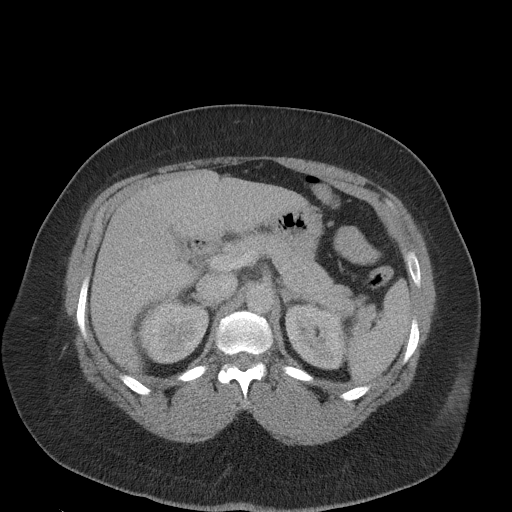
[im 74/95  soft-tissue]
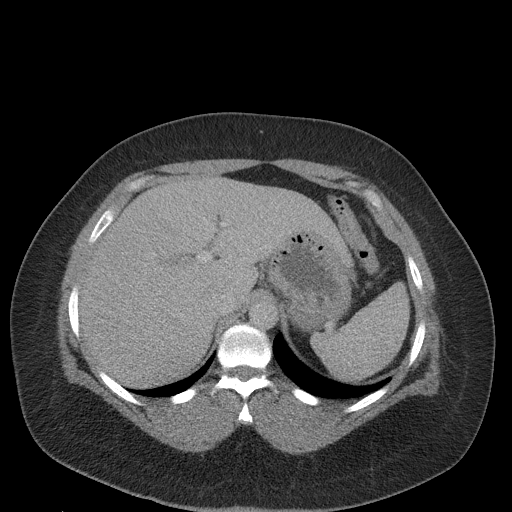
[im 84/95  soft-tissue]
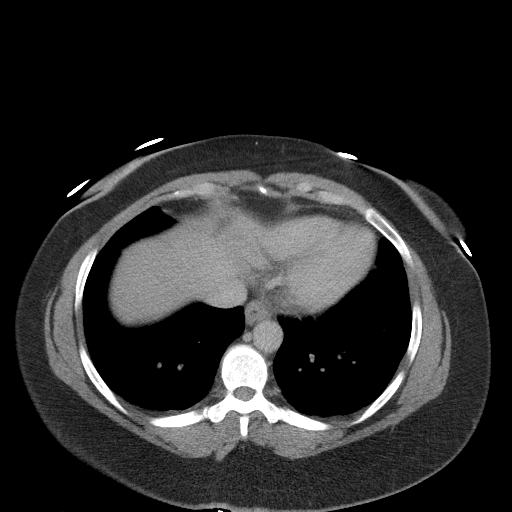
[im 89/95  soft-tissue]
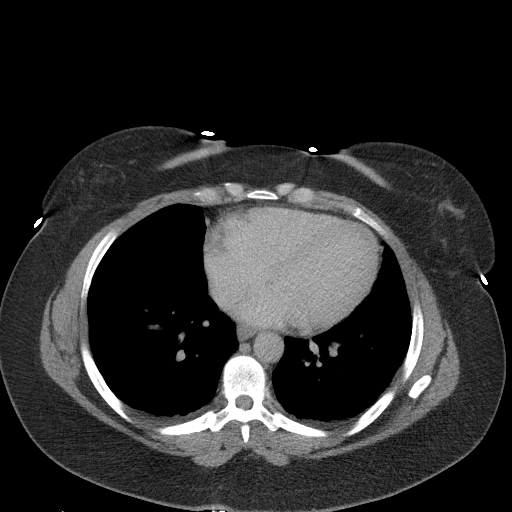

[Series 6: a/p w/ cor · coronal · 0.88mm/px · 3 of 168 slices shown]
[im 56/168  soft-tissue]
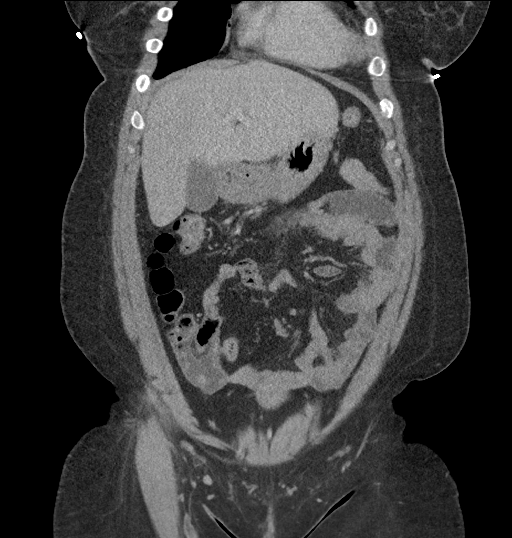
[im 75/168  soft-tissue]
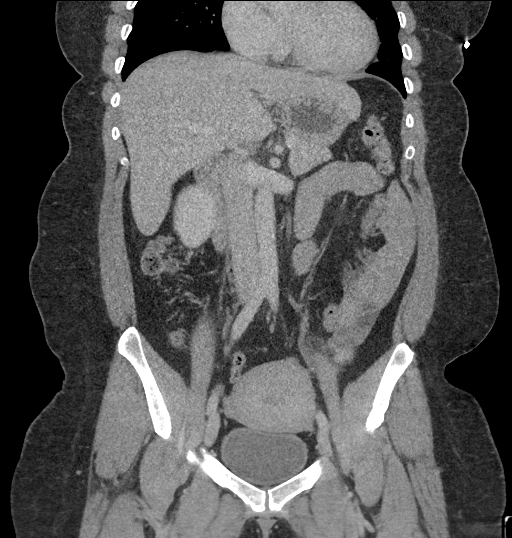
[im 93/168  soft-tissue]
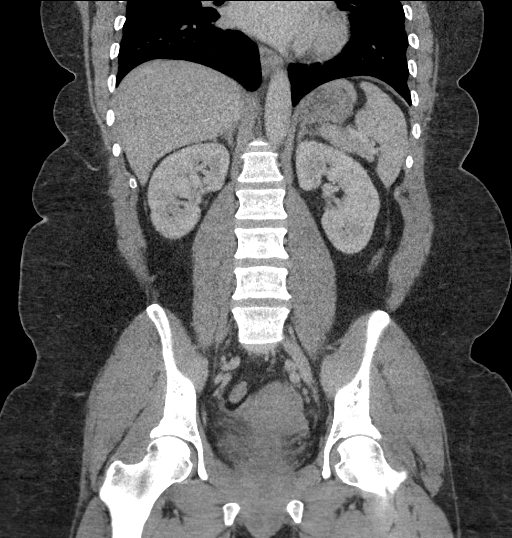

[17 of 46 positions shown; findings below may reference images not displayed]

FINDINGS: Lower chest: No acute abnormality.

Hepatobiliary: Probable mild hepatic steatosis noted without other
hepatic abnormality. The gallbladder is unremarkable. No biliary
dilatation.

Pancreas: Unremarkable

Spleen: Unremarkable

Adrenals/Urinary Tract: The kidneys, adrenal glands and bladder are
unremarkable.

Stomach/Bowel: Stomach is within normal limits. Appendix appears
normal. No evidence of bowel wall thickening, distention, or
inflammatory changes.

Vascular/Lymphatic: No significant vascular findings are present. No
enlarged abdominal or pelvic lymph nodes.

Reproductive: Uterus and bilateral adnexa are unremarkable.

Other: A trace amount of free pelvic fluid is likely physiologic. No
focal collection or pneumoperitoneum identified.

Musculoskeletal: No acute or significant osseous findings.
IMPRESSION: 1. No evidence of acute abnormality.
2. Probable mild hepatic steatosis.

## 2021-09-13 NOTE — Progress Notes (Deleted)
[facility] Return Visit  SUBJECTIVE  History of Present Illness: Maebell Lyvers is a 46 y.o. female seen in follow-up for mixed incontinence and levator spasm. Plan at last visit was to attend pelvic PT and to start Myrbetriq 25mg  and flexeril..     Past Medical History: Patient  has a past medical history of IIH (idiopathic intracranial hypertension) and Restless legs.   Past Surgical History: She  has no past surgical history on file.   Medications: She has a current medication list which includes the following prescription(s): acetazolamide, alclomethasone, eucrisa, cyclobenzaprine, mirabegron er, pregabalin, and valacyclovir.   Allergies: Patient is allergic to codeine, macrobid [nitrofurantoin], and penicillins.   Social History: Patient  reports that she quit smoking about 2 years ago. Her smoking use included cigarettes. She has never used smokeless tobacco. She reports that she does not currently use alcohol. She reports current drug use. Drug: Marijuana.      OBJECTIVE     Physical Exam: There were no vitals filed for this visit. Gen: No apparent distress, A&O x 3.  Detailed Urogynecologic Evaluation:  Deferred. Prior exam showed:  No flowsheet data found.     ASSESSMENT AND PLAN    Ms. Saffran is a 46 y.o. with:  No diagnosis found.

## 2021-09-14 ENCOUNTER — Ambulatory Visit: Payer: Medicaid Other | Admitting: Obstetrics and Gynecology

## 2021-09-20 ENCOUNTER — Other Ambulatory Visit (HOSPITAL_COMMUNITY): Payer: Self-pay

## 2021-09-20 ENCOUNTER — Telehealth: Payer: Self-pay

## 2021-09-20 NOTE — Telephone Encounter (Signed)
A Prior Authorization was initiated for this patients ACLOVATE OINTMENT through CoverMyMeds.   Key: [id number]

## 2021-09-21 NOTE — Telephone Encounter (Signed)
Prior Auth for patients medication ACLOVATE OINTMENT (Alclometasone Dipropionate 0.05% ointment) approved by [employer] ([place] [employer]) from 09/20/21 to 09/20/22.  Key: [id number]

## 2021-12-02 IMAGING — US US PELVIS COMPLETE WITH TRANSVAGINAL
1 series · 14 of 25 positions shown · non-contrast
Comparison: CT scan 12/09/2020

CLINICAL DATA: Concern for left ovarian cyst.



[Series 1: us pelvic complete with transvaginal · 94 acquisitions, 14 frames shown]
[im 1/94]
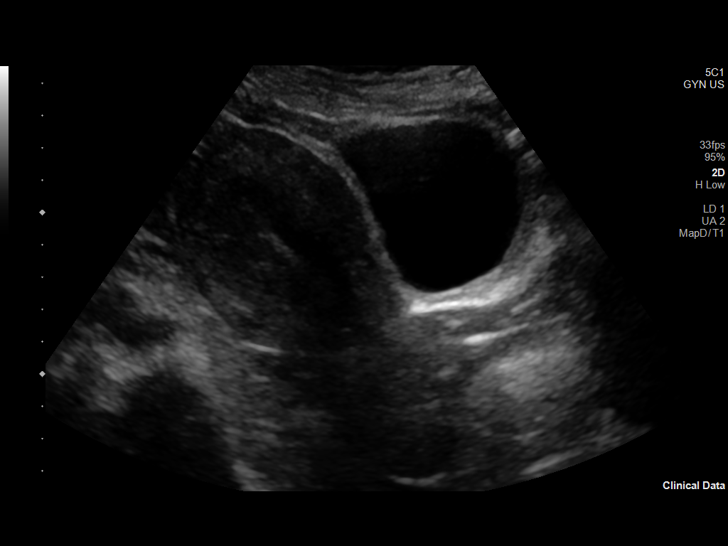
[im 8/94]
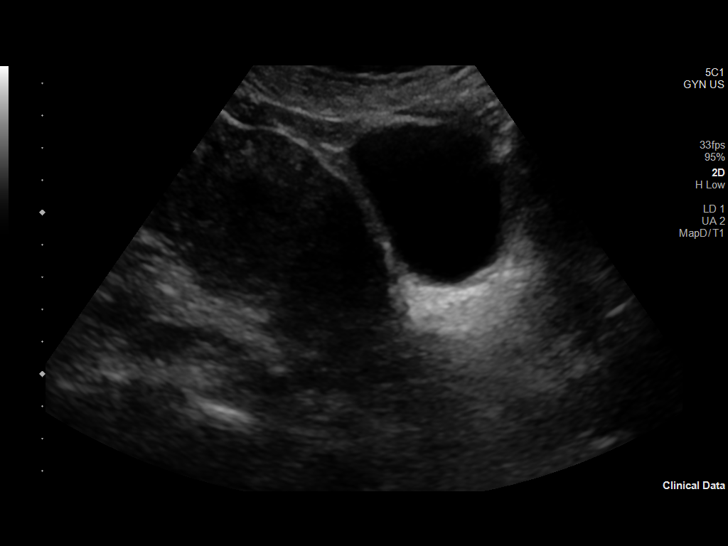
[im 16/94]
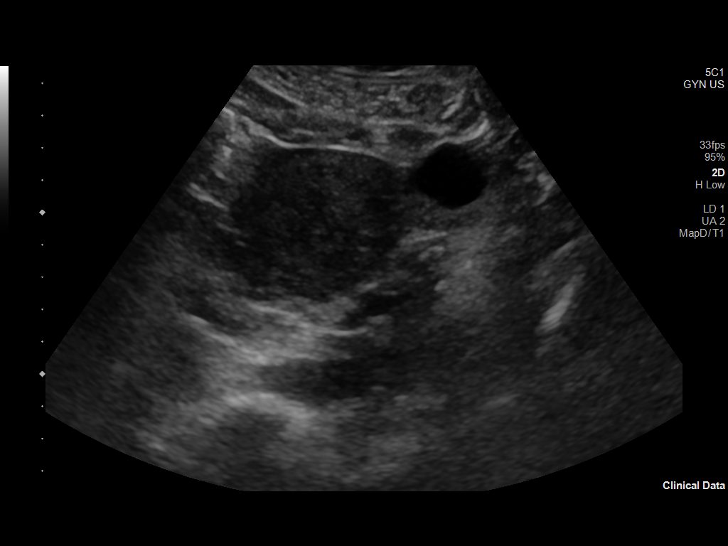
[im 24/94]
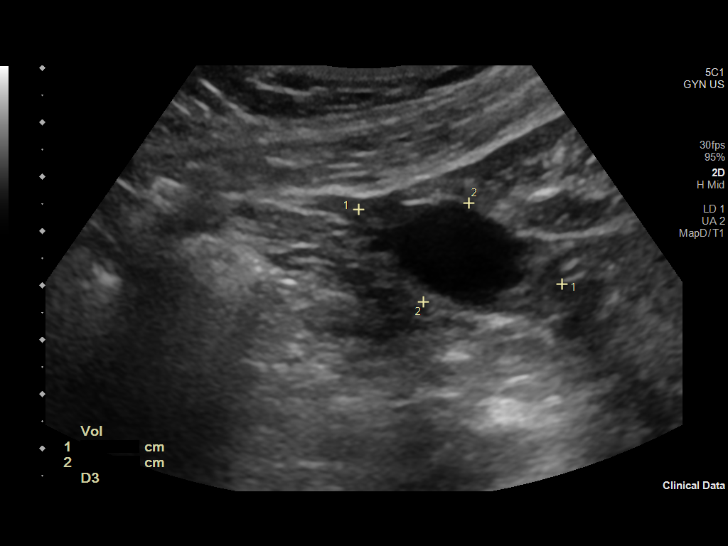
[im 32/94]
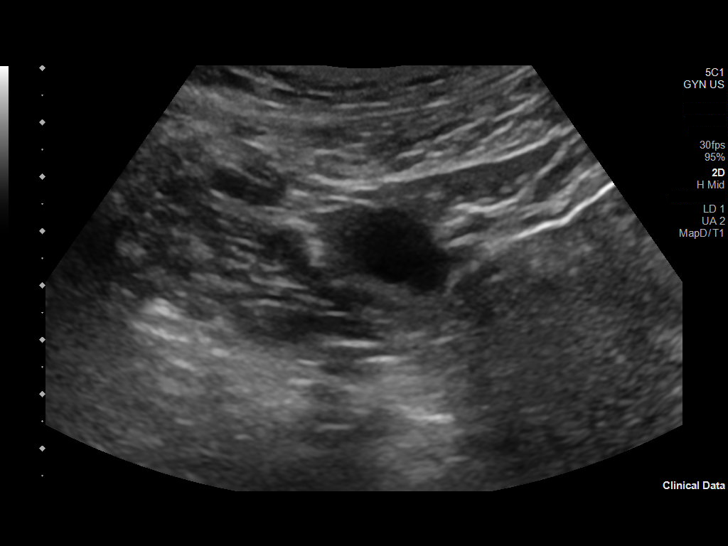
[im 35/94]
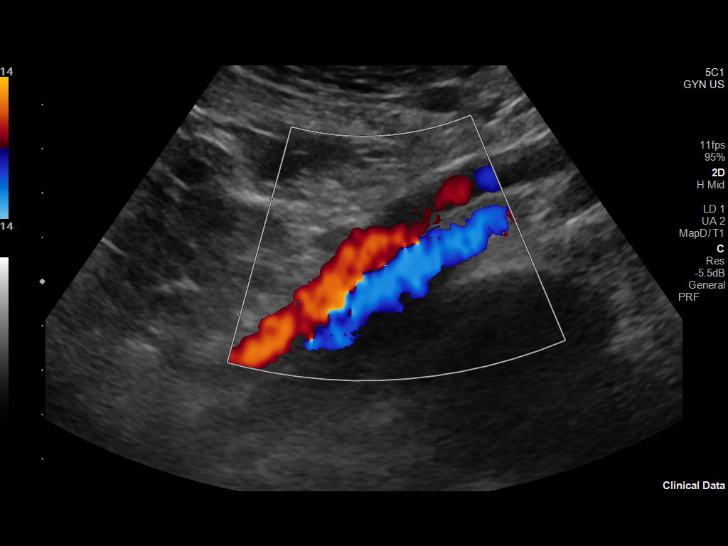
[im 43/94]
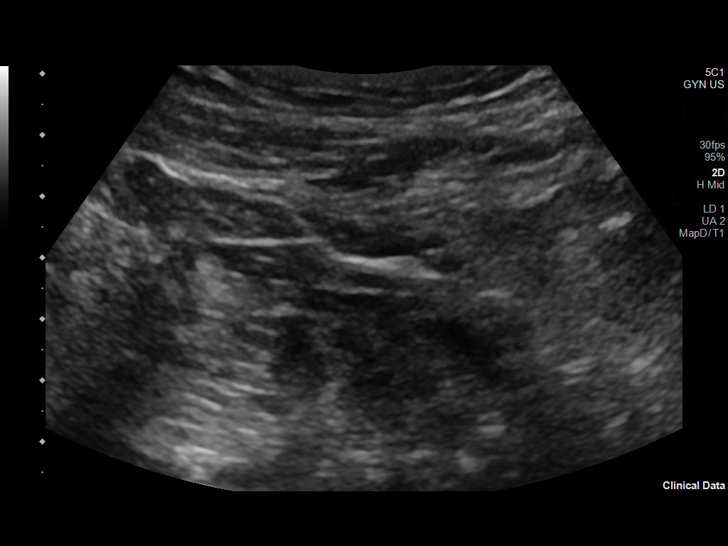
[im 51/94]
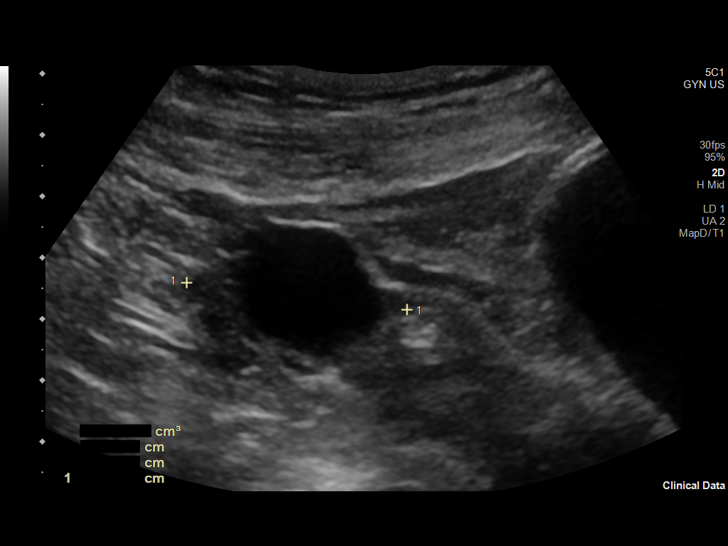
[im 59/94]
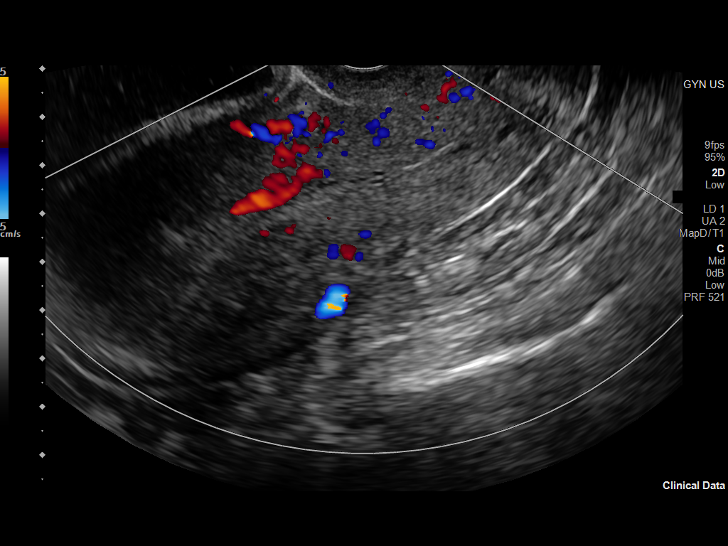
[im 63/94]
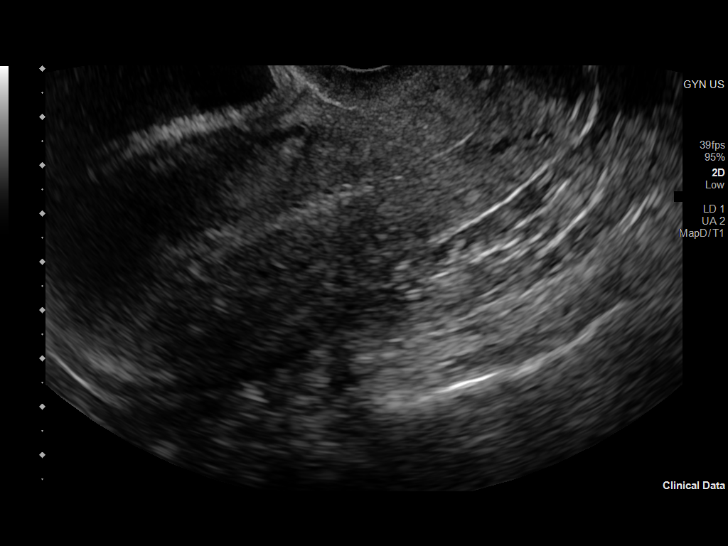
[im 70/94]
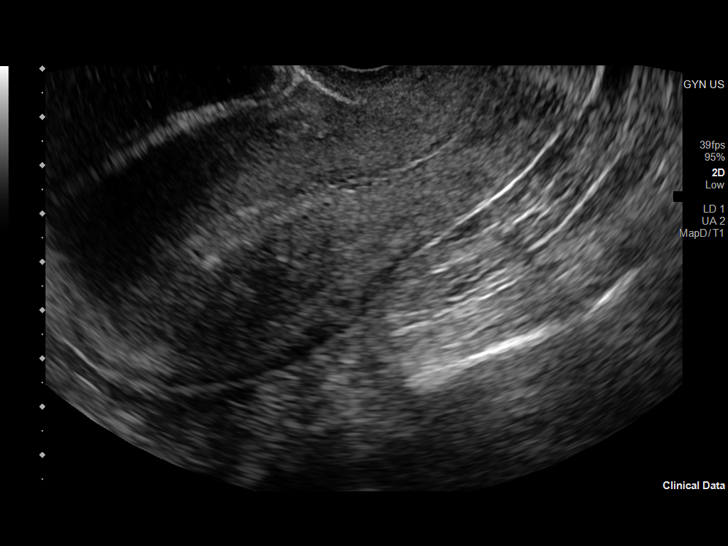
[im 78/94]
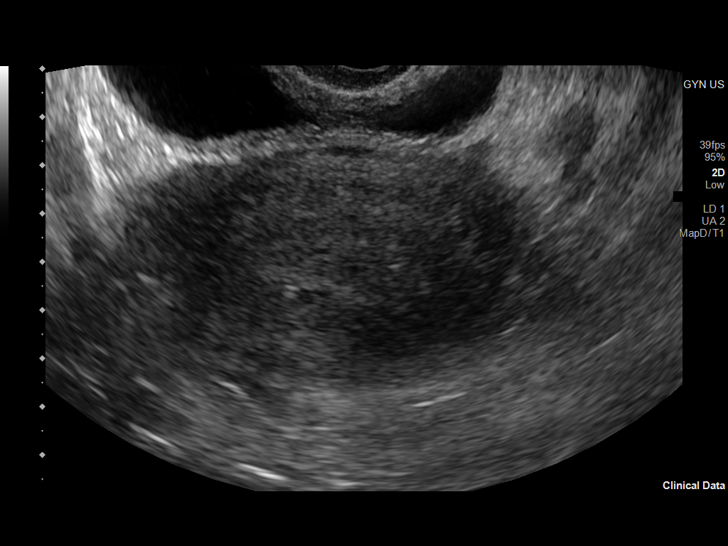
[im 86/94]
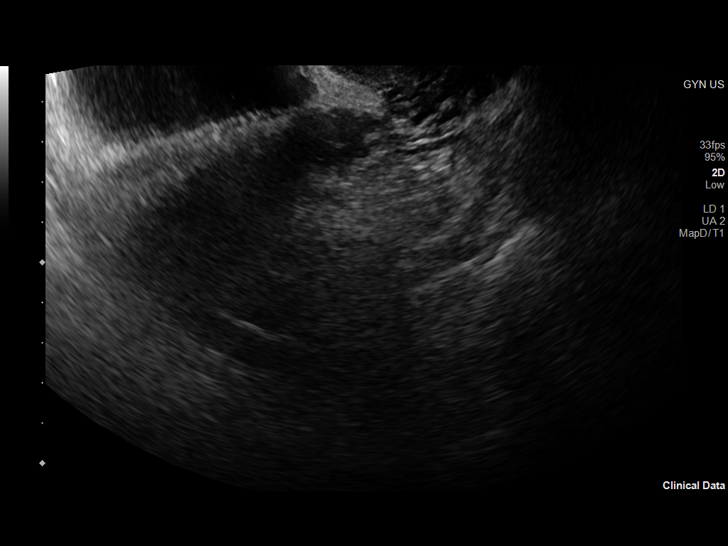
[im 94/94]
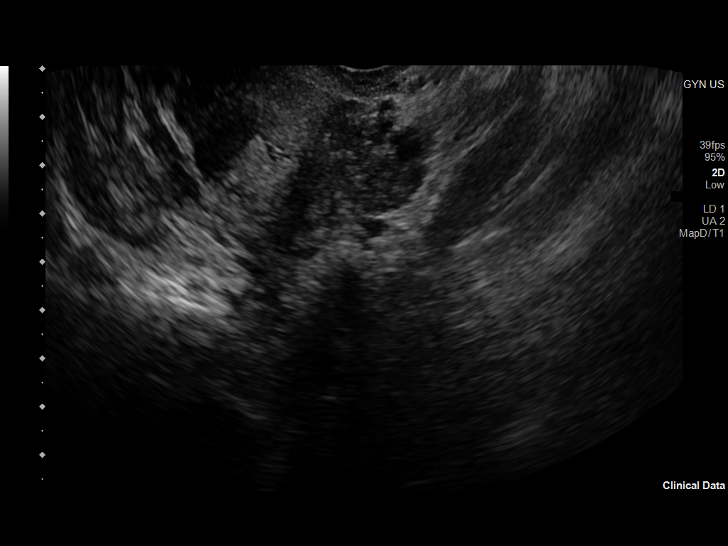

[14 of 25 positions shown; findings below may reference images not displayed]

FINDINGS: Uterus

Measurements: 12.6 x 5.7 x 6.4 cm = volume: To 137 mL. No fibroids
or other mass visualized.

Endometrium

Thickness: 10 mm.  No focal abnormality visualized.

Right ovary

Measurements: 2.7 x 2.6 x 3.6 cm = volume: 14 mL.  2.4 cm follicle.

Left ovary

Measurements: 3.5 x 2.4 x 2.8 cm = volume: 13 mL.  2.3 cm follicle.

Other findings

No abnormal free fluid.
IMPRESSION: Unremarkable pelvic ultrasound.

## 2021-12-04 DIAGNOSIS — N83202 Unspecified ovarian cyst, left side: Secondary | ICD-10-CM | POA: Diagnosis not present

## 2021-12-05 DIAGNOSIS — E559 Vitamin D deficiency, unspecified: Secondary | ICD-10-CM | POA: Diagnosis not present

## 2021-12-05 DIAGNOSIS — Z1159 Encounter for screening for other viral diseases: Secondary | ICD-10-CM | POA: Diagnosis not present

## 2021-12-05 DIAGNOSIS — Z0000 Encounter for general adult medical examination without abnormal findings: Secondary | ICD-10-CM | POA: Diagnosis not present

## 2021-12-05 DIAGNOSIS — Z01419 Encounter for gynecological examination (general) (routine) without abnormal findings: Secondary | ICD-10-CM | POA: Diagnosis not present

## 2022-05-31 ENCOUNTER — Telehealth: Payer: Self-pay

## 2022-05-31 NOTE — Telephone Encounter (Signed)
PA request was received for Cyclobenzaprine 5mg  tab.  Request was denied. Pt was to come in for a 6 week medication f/u in January 2023 and was a no show. LM on the VM for the patient to call back and schedule a f/u appt for more refills.

## 2022-06-24 ENCOUNTER — Encounter: Payer: Self-pay | Admitting: *Deleted

## 2022-07-20 ENCOUNTER — Encounter (HOSPITAL_COMMUNITY): Payer: Self-pay

## 2022-07-20 ENCOUNTER — Ambulatory Visit (HOSPITAL_COMMUNITY)
Admission: EM | Admit: 2022-07-20 | Discharge: 2022-07-20 | Disposition: A | Discharge status: Home / Self Care | Payer: Medicaid Other | Attending: Emergency Medicine | Admitting: Emergency Medicine

## 2022-07-20 DIAGNOSIS — H66002 Acute suppurative otitis media without spontaneous rupture of ear drum, left ear: Secondary | ICD-10-CM

## 2022-07-20 DIAGNOSIS — S161XXA Strain of muscle, fascia and tendon at neck level, initial encounter: Secondary | ICD-10-CM | POA: Diagnosis not present

## 2022-07-20 MED ORDER — DOXYCYCLINE HYCLATE 100 MG PO CAPS: 100.0000 mg | ORAL_CAPSULE | Freq: Two times a day (BID) | ORAL | 0 refills | Status: AC

## 2022-07-20 NOTE — Discharge Instructions (Addendum)
Please take medication as prescribed. Take with food to avoid upset stomach.  Try the attached neck exercices Warm compress to the neck 10-15 minutes at a time a few times daily Motrin up to 600 mg three times daily as needed  Return if symptoms persist

## 2022-07-20 NOTE — ED Provider Notes (Signed)
[facility]    CSN: [id number] Arrival date & time: 07/20/22  1137      History   Chief Complaint Chief Complaint  Patient presents with   Neck Pain   Otalgia    HPI Darlene Bowen is a 46 y.o. female.  Presents with 4-day history of left ear pain. Feels "deep" in her ear.  No drainage.  Denies fevers  Additionally some left neck pain with motion Denies any injury/trauma Thought she slept on it weird Takes flexeril without relief  Takes Lyrica daily  Past Medical History:  Diagnosis Date   IIH (idiopathic intracranial hypertension)    Restless legs     Patient Active Problem List   Diagnosis Date Noted   Eczema 08/07/2021   Class 3 severe obesity due to excess calories with body mass index (BMI) of 40.0 to 44.9 in adult (HCC) 08/07/2021   Recurrent genital HSV (herpes simplex virus) infection 08/07/2021   Left flank pain 12/26/2020   Dysphagia 12/26/2020   Low iron 09/05/2020   DVT (deep venous thrombosis) (HCC) 02/15/2020   H/O Bell's palsy 02/15/2020   Vitamin B1 deficiency 02/15/2020   Foot pain 01/05/2018   Tailor's bunion 01/05/2018   Tobacco user 01/05/2018    History reviewed. No pertinent surgical history.  OB History     Gravida  20   Para      Term      Preterm      AB      Living  4      SAB      IAB      Ectopic      Multiple      Live Births  4            Home Medications    Prior to Admission medications   Medication Sig Start Date End Date Taking? Authorizing Provider  doxycycline (VIBRAMYCIN) 100 MG capsule Take 1 capsule (100 mg total) by mouth 2 (two) times daily for 5 days. 07/20/22 07/25/22 Yes Lasalle Abee, Lurena Joiner, PA-C  acetaZOLAMIDE (DIAMOX) 250 MG tablet Take 250 mg by mouth 2 (two) times daily.    [provider]  alclomethasone (ACLOVATE) 0.05 % ointment Apply topically 2 (two) times daily. 08/07/21   Lilland, Alana, DO  Crisaborole (EUCRISA) 2 % OINT Apply 1 application topically daily  as needed. 08/07/21   Lilland, Alana, DO  cyclobenzaprine (FLEXERIL) 5 MG tablet Take 1 tablet (5 mg total) by mouth 3 (three) times daily as needed for muscle spasms. 07/31/21   Marguerita Beards, MD  mirabegron ER (MYRBETRIQ) 25 MG TB24 tablet Take 1 tablet (25 mg total) by mouth daily. 07/31/21   Marguerita Beards, MD  pregabalin (LYRICA) 50 MG capsule Take 50 mg by mouth. Take 50mg  daily in the morning and 100mg  at lunch and at bedtime    [provider]  valACYclovir (VALTREX) 1000 MG tablet Take 1 tablet (1,000 mg total) by mouth daily. 08/07/21   , DO    Family History History reviewed. No pertinent family history.  Social History Social History   Tobacco Use   Smoking status: Former    Types: Cigarettes    Quit date: 09/2019    Years since quitting: 2.8   Smokeless tobacco: Never  Vaping Use   Vaping Use: Never used  Substance Use Topics   Alcohol use: Not Currently   Drug use: Yes    Types: Marijuana     Allergies   Codeine,  Macrobid [nitrofurantoin], and Penicillins   Review of Systems Review of Systems  HENT:  Positive for ear pain.   Musculoskeletal:  Positive for neck pain.   Per HPI   Physical Exam Triage Vital Signs ED Triage Vitals  Enc Vitals Group     BP 07/20/22 1216 122/85     Pulse Rate 07/20/22 1216 80     Resp 07/20/22 1216 12     Temp 07/20/22 1216 98 F (36.7 C)     Temp Source 07/20/22 1216 Oral     SpO2 07/20/22 1216 99 %     Weight --      Height --      Head Circumference --      Peak Flow --      Pain Score 07/20/22 1213 9     Pain Loc --      Pain Edu? --      Excl. in GC? --    No data found.  Updated Vital Signs BP 122/85 (BP Location: Left Arm)   Pulse 80   Temp 98 F (36.7 C) (Oral)   Resp 12   LMP  (LMP Unknown)   SpO2 99%    Physical Exam Vitals and nursing note reviewed.  Constitutional:      General: She is not in acute distress.    Appearance: Normal appearance.  HENT:      Right Ear: Tympanic membrane and ear canal normal.     Left Ear: A middle ear effusion is present. No mastoid tenderness.     Ears:      Comments: Suppurative fluid behind left TM. Not bulging or retracted     Nose: No congestion.     Mouth/Throat:     Mouth: Mucous membranes are moist.     Pharynx: Oropharynx is clear.  Eyes:     Conjunctiva/sclera: Conjunctivae normal.  Neck:     Comments: Full range of motion at the neck, pain elicited with looking left. No spinal tenderness Cardiovascular:     Rate and Rhythm: Normal rate and regular rhythm.     Pulses: Normal pulses.  Pulmonary:     Effort: Pulmonary effort is normal.  Musculoskeletal:     Cervical back: No bony tenderness. Pain with movement present. Normal range of motion.  Lymphadenopathy:     Cervical: No cervical adenopathy.  Neurological:     Mental Status: She is alert and oriented to person, place, and time.      UC Treatments / Results  Labs (all labs ordered are listed, but only abnormal results are displayed) Labs Reviewed - No data to display  EKG   Radiology No results found.  Procedures Procedures   Medications Ordered in UC Medications - No data to display  Initial Impression / Assessment and Plan / UC Course  I have reviewed the triage vital signs and the nursing notes.  Pertinent labs & imaging results that were available during my care of the patient were reviewed by me and considered in my medical decision making (see chart for details).  Left AOM PCN allergy is swelling. Will avoid cephalosporins. Doxy twice daily for 5 days  Neck pain likely muscular Discussed gentle stretching, warm compress, motrin as needed  Return precautions discussed. Patient agrees to plan  Final Clinical Impressions(s) / UC Diagnoses   Final diagnoses:  Acute suppurative otitis media of left ear without spontaneous rupture of tympanic membrane, recurrence not specified  Strain of neck muscle, initial  encounter  Discharge Instructions      Please take medication as prescribed. Take with food to avoid upset stomach.  Try the attached neck exercices Warm compress to the neck 10-15 minutes at a time a few times daily Motrin up to 600 mg three times daily as needed  Return if symptoms persist     ED Prescriptions     Medication Sig Dispense Auth. Provider   doxycycline (VIBRAMYCIN) 100 MG capsule Take 1 capsule (100 mg total) by mouth 2 (two) times daily for 5 days. 10 capsule Steffie Waggoner, Lurena Joiner, PA-C      I have reviewed the PDMP during this encounter.   Marlow Baars, [place] 07/20/22 1334

## 2022-07-20 NOTE — ED Triage Notes (Signed)
Pt is here for right ear pain and neck pain x4 days

## 2022-08-03 ENCOUNTER — Encounter (HOSPITAL_COMMUNITY): Payer: Self-pay

## 2022-08-03 ENCOUNTER — Ambulatory Visit (HOSPITAL_COMMUNITY)
Admission: EM | Admit: 2022-08-03 | Discharge: 2022-08-03 | Disposition: A | Discharge status: Home / Self Care | Payer: Medicaid Other

## 2022-08-03 DIAGNOSIS — M7912 Myalgia of auxiliary muscles, head and neck: Secondary | ICD-10-CM | POA: Diagnosis not present

## 2022-08-03 NOTE — Discharge Instructions (Signed)
Your ear appears normal.  I believe that your symptoms are related to strain/injury of your sternocleidomastoid muscle.  Continue your muscle relaxers and pain medication as prescribed your primary care.  Use heat and gentle stretch.  Follow-up with primary care.  If anything changes or worsens please return for reevaluation.

## 2022-08-03 NOTE — ED Triage Notes (Signed)
Pt was seen on 07/20/2022 for ear pain. Pt reports her symptoms have not improved.  Pt reports ear pain and sore throat.

## 2022-08-03 NOTE — ED Provider Notes (Signed)
[facility]    CSN: [id number] Arrival date & time: 08/03/22  1016      History   Chief Complaint Chief Complaint  Patient presents with   Ear Pain   Sore Throat    HPI Darlene Bowen is a 46 y.o. female.   Patient presents today for reevaluation of left ear pain.  Reports this has been present for approximately 3 weeks.  She was seen by our clinic 07/20/2022 at which point she was started on doxycycline.  Reports that her symptoms improved slightly but did not ever resolve with this medication.  She continues to have significant pain that is just inferior to her left ear and is worse when she lays down and tries to sleep.  She does have a complicated past medical history including unspecified neurological condition as well as idiopathic intracranial hypertension.  She is taking cyclobenzaprine, amitriptyline, Lyrica and despite this medication regimen she continues to have pain.  Pain is rated 8 on a 0-10 pain scale, described as soreness, worse with palpation or movement, no alleviating factors noted.  Denies any otorrhea, cold symptoms, fever.  Denies regular use of Q-tips, earplugs, earplugs.  Denies any recent swimming or airplane travel.    Past Medical History:  Diagnosis Date   IIH (idiopathic intracranial hypertension)    Restless legs     Patient Active Problem List   Diagnosis Date Noted   Eczema 08/07/2021   Class 3 severe obesity due to excess calories with body mass index (BMI) of 40.0 to 44.9 in adult [facility]) 08/07/2021   Recurrent genital HSV (herpes simplex virus) infection 08/07/2021   Left flank pain 12/26/2020   Dysphagia 12/26/2020   Low iron 09/05/2020   DVT (deep venous thrombosis) (HCC) 02/15/2020   H/O Bell's palsy 02/15/2020   Vitamin B1 deficiency 02/15/2020   Foot pain 01/05/2018   Tailor's bunion 01/05/2018   Tobacco user 01/05/2018    History reviewed. No pertinent surgical history.  OB History     Gravida  20   Para       Term      Preterm      AB      Living  4      SAB      IAB      Ectopic      Multiple      Live Births  4            Home Medications    Prior to Admission medications   Medication Sig Start Date End Date Taking? Authorizing Provider  acetaZOLAMIDE (DIAMOX) 250 MG tablet Take 250 mg by mouth 2 (two) times daily.    [provider]  alclomethasone (ACLOVATE) 0.05 % ointment Apply topically 2 (two) times daily. 08/07/21   Lilland, Alana, DO  Crisaborole (EUCRISA) 2 % OINT Apply 1 application topically daily as needed. 08/07/21   Lilland, Alana, DO  cyclobenzaprine (FLEXERIL) 5 MG tablet Take 1 tablet (5 mg total) by mouth 3 (three) times daily as needed for muscle spasms. 07/31/21   Marguerita Beards, MD  mirabegron ER (MYRBETRIQ) 25 MG TB24 tablet Take 1 tablet (25 mg total) by mouth daily. 07/31/21   Marguerita Beards, MD  pregabalin (LYRICA) 50 MG capsule Take 50 mg by mouth. Take 50mg  daily in the morning and 100mg  at lunch and at bedtime    [provider]  valACYclovir (VALTREX) 1000 MG tablet Take 1 tablet (1,000 mg total) by mouth daily. 08/07/21  Evelena Leyden, DO    Family History History reviewed. No pertinent family history.  Social History Social History   Tobacco Use   Smoking status: Former    Types: Cigarettes    Quit date: 09/2019    Years since quitting: 2.9   Smokeless tobacco: Never  Vaping Use   Vaping Use: Never used  Substance Use Topics   Alcohol use: Not Currently   Drug use: Yes    Types: Marijuana     Allergies   Codeine, Macrobid [nitrofurantoin], and Penicillins   Review of Systems Review of Systems  Constitutional:  Positive for activity change. Negative for appetite change, fatigue and fever.  HENT:  Positive for ear pain. Negative for congestion, sinus pressure, sneezing and sore throat.   Respiratory:  Negative for cough and shortness of breath.   Cardiovascular:  Negative for chest pain.   Gastrointestinal:  Negative for abdominal pain, diarrhea, nausea and vomiting.     Physical Exam Triage Vital Signs ED Triage Vitals [08/03/22 1121]  Enc Vitals Group     BP 124/80     Pulse Rate 78     Resp 16     Temp 98.5 F (36.9 C)     Temp Source Oral     SpO2 98 %     Weight      Height      Head Circumference      Peak Flow      Pain Score      Pain Loc      Pain Edu?      Excl. in GC?    No data found.  Updated Vital Signs BP 124/80 (BP Location: Left Arm)   Pulse 78   Temp 98.5 F (36.9 C) (Oral)   Resp 16   LMP  (LMP Unknown)   SpO2 98%   Visual Acuity Right Eye Distance:   Left Eye Distance:   Bilateral Distance:    Right Eye Near:   Left Eye Near:    Bilateral Near:     Physical Exam Vitals reviewed.  Constitutional:      General: She is awake. She is not in acute distress.    Appearance: Normal appearance. She is well-developed. She is not ill-appearing.     Comments: Very pleasant female appears stated age in no acute distress sitting comfortably in exam room  HENT:     Head: Normocephalic and atraumatic.     Right Ear: Tympanic membrane, ear canal and external ear normal. Tympanic membrane is not erythematous or bulging.     Left Ear: Tympanic membrane, ear canal and external ear normal. Tympanic membrane is not erythematous or bulging.     Ears:     Comments: Normal-appearing left ear    Mouth/Throat:     Pharynx: Uvula midline. No oropharyngeal exudate or posterior oropharyngeal erythema.  Cardiovascular:     Rate and Rhythm: Normal rate and regular rhythm.     Heart sounds: Normal heart sounds, S1 normal and S2 normal. No murmur heard. Pulmonary:     Effort: Pulmonary effort is normal.     Breath sounds: Normal breath sounds. No wheezing, rhonchi or rales.     Comments: Clear auscultation bilaterally Musculoskeletal:     Cervical back: Tenderness present. No bony tenderness.     Thoracic back: No tenderness or bony tenderness.      Lumbar back: No tenderness or bony tenderness.     Comments: Tenderness to palpation over origin of SCM  and along belly of this muscle.  Pain is increased with rotation of head.    Psychiatric:        Behavior: Behavior is cooperative.      UC Treatments / Results  Labs (all labs ordered are listed, but only abnormal results are displayed) Labs Reviewed - No data to display  EKG   Radiology No results found.  Procedures Procedures (including critical care time)  Medications Ordered in UC Medications - No data to display  Initial Impression / Assessment and Plan / UC Course  I have reviewed the triage vital signs and the nursing notes.  Pertinent labs & imaging results that were available during my care of the patient were reviewed by me and considered in my medical decision making (see chart for details).     Ear is normal-appearing with no evidence of infection.  Pain is localized over SCM and discussed that this is the likely cause of symptoms.  She is to continue current medication regimen including Flexeril for symptom management.  Can use over-the-counter analgesics for additional symptom relief, such as Tylenol.  Recommended heat, rest, stretch for symptom relief.  Encouraged patient to follow-up with primary care and consider referral to physical therapy.  Discussed that if she has any changing or worsening symptoms she needs to be seen immediately.  Strict return precautions given.  Final Clinical Impressions(s) / UC Diagnoses   Final diagnoses:  Sternocleidomastoid muscle tenderness     Discharge Instructions      Your ear appears normal.  I believe that your symptoms are related to strain/injury of your sternocleidomastoid muscle.  Continue your muscle relaxers and pain medication as prescribed your primary care.  Use heat and gentle stretch.  Follow-up with primary care.  If anything changes or worsens please return for reevaluation.     ED Prescriptions    None    PDMP not reviewed this encounter.   Jeani Hawking, PA-C 08/03/22 1236

## 2022-08-15 ENCOUNTER — Other Ambulatory Visit: Payer: Self-pay

## 2022-08-29 ENCOUNTER — Other Ambulatory Visit: Payer: Self-pay | Admitting: Family Medicine

## 2022-12-29 ENCOUNTER — Ambulatory Visit (HOSPITAL_COMMUNITY)
Admission: EM | Admit: 2022-12-29 | Discharge: 2022-12-29 | Disposition: A | Discharge status: Home / Self Care | Payer: Medicaid Other | Attending: Emergency Medicine | Admitting: Emergency Medicine

## 2022-12-29 ENCOUNTER — Encounter (HOSPITAL_COMMUNITY): Payer: Self-pay

## 2022-12-29 DIAGNOSIS — H9202 Otalgia, left ear: Secondary | ICD-10-CM

## 2022-12-29 DIAGNOSIS — H538 Other visual disturbances: Secondary | ICD-10-CM | POA: Diagnosis not present

## 2022-12-29 MED ORDER — CIPROFLOXACIN-DEXAMETHASONE 0.3-0.1 % OT SUSP: 4.0000 [drp] | Freq: Two times a day (BID) | OTIC | 0 refills | Status: DC

## 2022-12-29 NOTE — ED Triage Notes (Signed)
Here for L-ear pain for several weeks.

## 2022-12-29 NOTE — ED Provider Notes (Signed)
[facility]    CSN: [id number] Arrival date & time: 12/29/22  1118      History   Chief Complaint Chief Complaint  Patient presents with   Eye Problem    HPI Bertina Guthridge is a 47 y.o. female.   Patient presents for evaluation of left-sided ear pain occurring since November 2024, initially was evaluated at that time, deemed to have inner ear infection, was evaluated 2 weeks later for persisting pain, infection resolved after antibiotic use.  Endorses over the last week pain has become more intense and can be felt when ear is directly pressed and when laying onto the left side.  Has not attempted treatment further.  Denies drainage, pruritus or decreased hearing.  Over the last 3 to 4 days endorses increased blurred vision to the left eye, has been told in the past that she had the beginning stages of glaucoma, has not notified or been reevaluated by ophthalmology.  Endorses that she has been having generalized bodyaches as she is being worked up for multiple sclerosis, currently taking medications for treatment.  Denies fevers.  Past Medical History:  Diagnosis Date   IIH (idiopathic intracranial hypertension)    Restless legs     Patient Active Problem List   Diagnosis Date Noted   Eczema 08/07/2021   Class 3 severe obesity due to excess calories with body mass index (BMI) of 40.0 to 44.9 in adult [facility]) 08/07/2021   Recurrent genital HSV (herpes simplex virus) infection 08/07/2021   Left flank pain 12/26/2020   Dysphagia 12/26/2020   Low iron 09/05/2020   DVT (deep venous thrombosis) (HCC) 02/15/2020   H/O Bell's palsy 02/15/2020   Vitamin B1 deficiency 02/15/2020   Foot pain 01/05/2018   Tailor's bunion 01/05/2018   Tobacco user 01/05/2018    History reviewed. No pertinent surgical history.  OB History     Gravida  20   Para      Term      Preterm      AB      Living  4      SAB      IAB      Ectopic      Multiple      Live Births  4             Home Medications    Prior to Admission medications   Medication Sig Start Date End Date Taking? Authorizing Provider  acetaZOLAMIDE (DIAMOX) 250 MG tablet Take 250 mg by mouth 2 (two) times daily.    [provider]  alclomethasone (ACLOVATE) 0.05 % ointment Apply topically 2 (two) times daily. 08/07/21   Lilland, Alana, DO  Crisaborole (EUCRISA) 2 % OINT Apply 1 application topically daily as needed. 08/07/21   Lilland, Alana, DO  cyclobenzaprine (FLEXERIL) 5 MG tablet Take 1 tablet (5 mg total) by mouth 3 (three) times daily as needed for muscle spasms. 07/31/21   Marguerita Beards, MD  mirabegron ER (MYRBETRIQ) 25 MG TB24 tablet Take 1 tablet (25 mg total) by mouth daily. 07/31/21   Marguerita Beards, MD  pregabalin (LYRICA) 50 MG capsule Take 50 mg by mouth. Take 50mg  daily in the morning and 100mg  at lunch and at bedtime    [provider]  valACYclovir (VALTREX) 1000 MG tablet TAKE 1 TABLET(1000 MG) BY MOUTH DAILY 08/29/22   Littie Deeds, MD    Family History History reviewed. No pertinent family history.  Social History Social History   Tobacco  Use   Smoking status: Former    Types: Cigarettes    Quit date: 09/2019    Years since quitting: 3.3   Smokeless tobacco: Never  Vaping Use   Vaping Use: Never used  Substance Use Topics   Alcohol use: Not Currently   Drug use: Yes    Types: Marijuana     Allergies   Codeine, Macrobid [nitrofurantoin], and Penicillins   Review of Systems Review of Systems   Physical Exam Triage Vital Signs ED Triage Vitals [12/29/22 1203]  Enc Vitals Group     BP (!) 155/108     Pulse Rate 94     Resp 16     Temp 98.8 F (37.1 C)     Temp Source Oral     SpO2 95 %     Weight      Height      Head Circumference      Peak Flow      Pain Score      Pain Loc      Pain Edu?      Excl. in GC?    No data found.  Updated Vital Signs BP (!) 155/108 (BP Location: Left Arm)   Pulse 94    Temp 98.8 F (37.1 C) (Oral)   Resp 16   SpO2 95%   Visual Acuity Right Eye Distance:   Left Eye Distance:   Bilateral Distance:    Right Eye Near:   Left Eye Near:    Bilateral Near:     Physical Exam Constitutional:      Appearance: Normal appearance.  HENT:     Right Ear: Tympanic membrane, ear canal and external ear normal.     Left Ear: Tympanic membrane, ear canal and external ear normal.  Eyes:     Extraocular Movements: Extraocular movements intact.     Conjunctiva/sclera: Conjunctivae normal.     Pupils: Pupils are equal, round, and reactive to light.  Pulmonary:     Effort: Pulmonary effort is normal.  Neurological:     Mental Status: She is alert and oriented to person, place, and time. Mental status is at baseline.      UC Treatments / Results  Labs (all labs ordered are listed, but only abnormal results are displayed) Labs Reviewed - No data to display  EKG   Radiology No results found.  Procedures Procedures (including critical care time)  Medications Ordered in UC Medications - No data to display  Initial Impression / Assessment and Plan / UC Course  I have reviewed the triage vital signs and the nursing notes.  Pertinent labs & imaging results that were available during my care of the patient were reviewed by me and considered in my medical decision making (see chart for details).  Otalgia of the left ear, blurred vision of the left eye  No abnormalities present to the left ear however as symptoms have been persisting for months will prophylactically provide eardrops, Ciprodex prescribed, advised against ear and ear cleaning, irrigation or objects placed in the canal, given walker referral to ear nose and throat for further evaluation and management, for blurred vision most likely related to glaucoma as vision is grossly intact in office given walking referral to ophthalmology Final Clinical Impressions(s) / UC Diagnoses   Final diagnoses:   None   Discharge Instructions   None    ED Prescriptions   None    PDMP not reviewed this encounter.   Valinda Hoar, NP  12/29/22 1327  

## 2022-12-29 NOTE — Discharge Instructions (Signed)
Blurry vision seems consistent with glaucoma, please follow-up with eye specialist for further evaluation and management, information is listed on front page, if this form of referral is not excepted please continue to reach out to your primary doctor  On exam there are no abnormalities to the ear that would indicate infection but as she events of persistent pain for months will provide eardrops which is a mixture of antibiotic and steroid, may place 4 drops into the ear twice daily  You may continue use of pain medicines to help as needed  May hold warm compresses to the external ear for additional comfort  Avoid ear cleaning or object placement into the ear until all symptoms have resolved  Please schedule follow-up appointment with ear nose and throat, information is listed on front page

## 2023-01-22 ENCOUNTER — Ambulatory Visit (HOSPITAL_COMMUNITY)
Admission: EM | Admit: 2023-01-22 | Discharge: 2023-01-22 | Disposition: A | Discharge status: Home / Self Care | Payer: Medicaid Other | Attending: Emergency Medicine | Admitting: Emergency Medicine

## 2023-01-22 ENCOUNTER — Encounter (HOSPITAL_COMMUNITY): Payer: Self-pay | Admitting: *Deleted

## 2023-01-22 DIAGNOSIS — R350 Frequency of micturition: Secondary | ICD-10-CM | POA: Diagnosis not present

## 2023-01-22 DIAGNOSIS — G35 Multiple sclerosis: Secondary | ICD-10-CM

## 2023-01-22 DIAGNOSIS — S29012A Strain of muscle and tendon of back wall of thorax, initial encounter: Secondary | ICD-10-CM

## 2023-01-22 DIAGNOSIS — G35D Multiple sclerosis, unspecified: Secondary | ICD-10-CM

## 2023-01-22 HISTORY — DX: Unspecified glaucoma: H40.9

## 2023-01-22 LAB — POCT URINALYSIS DIP (MANUAL ENTRY)
Bilirubin, UA: NEGATIVE
Blood, UA: NEGATIVE
Glucose, UA: NEGATIVE mg/dL
Ketones, POC UA: NEGATIVE mg/dL
Leukocytes, UA: NEGATIVE
Nitrite, UA: NEGATIVE
Protein Ur, POC: NEGATIVE mg/dL
Spec Grav, UA: 1.015 (ref 1.010–1.025)
Urobilinogen, UA: 0.2 E.U./dL
pH, UA: 8 (ref 5.0–8.0)

## 2023-01-22 LAB — POCT URINE PREGNANCY: Preg Test, Ur: NEGATIVE

## 2023-01-22 MED ORDER — PREDNISONE 10 MG (21) PO TBPK: ORAL_TABLET | Freq: Every day | ORAL | 0 refills | Status: DC

## 2023-01-22 MED ORDER — METHYLPREDNISOLONE SODIUM SUCC 125 MG IJ SOLR
80.0000 mg | Freq: Once | INTRAMUSCULAR | Status: AC
  Administered 2023-01-22: 80 mg via INTRAMUSCULAR

## 2023-01-22 MED ORDER — METHYLPREDNISOLONE SODIUM SUCC 125 MG IJ SOLR
INTRAMUSCULAR | Status: AC
  Filled 2023-01-22: qty 2

## 2023-01-22 NOTE — ED Triage Notes (Signed)
C/O sharp constant left flank pain radiating around to left ribcage; pain worse with movement. Also c/o polyuria and urinary urgency. States she already takes Myrbetrique; urinating approx 7x/hr. States her urination "feels different".

## 2023-01-22 NOTE — Discharge Instructions (Addendum)
I feel as if you are either having a flare of your sciatica, a back strain or having a flareup of your MS.  Covering you with a steroid injection in clinic, and sending you home on steroids as well.  Please take all steroids as prescribed and until finished.  Please follow-up with the doctor who manages your MS for further evaluation.  Your urine was negative for infection.  Please return to this clinic if you develop any new or concerning symptoms.

## 2023-01-22 NOTE — ED Provider Notes (Signed)
[facility]    CSN: [id number] Arrival date & time: 01/22/23  0946      History   Chief Complaint Chief Complaint  Patient presents with   Flank Pain    HPI Darlene Bowen is a 47 y.o. female.   Patient presents to clinic for complaint of thoracic back pain and urinary urgency  that has been getting worse over the past 3-4 days.  She normally has low back pain, numbness and tingling in her left leg and urgency, however, her symptoms have intensified.  She denies any recent falls or trauma.  She does have a history of chronic pain.  She also reports a current HSV outbreak, taking valacyclovir.  She has been having some nausea, denies emesis, diarrhea or constipation.    The history is provided by the patient and medical records.  Flank Pain Pertinent negatives include no chest pain, no abdominal pain and no shortness of breath.    Past Medical History:  Diagnosis Date   Glaucoma    IIH (idiopathic intracranial hypertension)    Multiple sclerosis (HCC)    Restless legs     Patient Active Problem List   Diagnosis Date Noted   Eczema 08/07/2021   Class 3 severe obesity due to excess calories with body mass index (BMI) of 40.0 to 44.9 in adult [facility]) 08/07/2021   Recurrent genital HSV (herpes simplex virus) infection 08/07/2021   Left flank pain 12/26/2020   Dysphagia 12/26/2020   Low iron 09/05/2020   DVT (deep venous thrombosis) (HCC) 02/15/2020   H/O Bell's palsy 02/15/2020   Vitamin B1 deficiency 02/15/2020   Foot pain 01/05/2018   Tailor's bunion 01/05/2018   Tobacco user 01/05/2018    History reviewed. No pertinent surgical history.  OB History     Gravida  20   Para      Term      Preterm      AB      Living  4      SAB      IAB      Ectopic      Multiple      Live Births  4            Home Medications    Prior to Admission medications   Medication Sig Start Date End Date Taking? Authorizing Provider  acetaZOLAMIDE  (DIAMOX) 250 MG tablet Take 250 mg by mouth 2 (two) times daily.   Yes [provider]  alclomethasone (ACLOVATE) 0.05 % ointment Apply topically 2 (two) times daily. 08/07/21  Yes Lilland, Alana, DO  AMITRIPTYLINE HCL PO Take by mouth.   Yes [provider]  aspirin 325 MG tablet Take 325 mg by mouth daily.   Yes [provider]  BACLOFEN PO Take by mouth.   Yes [provider]  ciprofloxacin-dexamethasone (CIPRODEX) OTIC suspension Place 4 drops into the left ear 2 (two) times daily. 12/29/22  Yes White, Adrienne R, NP  Crisaborole (EUCRISA) 2 % OINT Apply 1 application topically daily as needed. 08/07/21  Yes Lilland, Alana, DO  mirabegron ER (MYRBETRIQ) 25 MG TB24 tablet Take 1 tablet (25 mg total) by mouth daily. 07/31/21  Yes Marguerita Beards, MD  predniSONE (STERAPRED UNI-PAK 21 TAB) 10 MG (21) TBPK tablet Take by mouth daily. Take 6 tabs by mouth daily  for 2 days, then 5 tabs for 2 days, then 4 tabs for 2 days, then 3 tabs for 2 days, 2 tabs for 2 days,  then 1 tab by mouth daily for 2 days 01/22/23  Yes Rinaldo Ratel, [place], FNP  pregabalin (LYRICA) 50 MG capsule Take 50 mg by mouth. Take 50mg  daily in the morning and 100mg  at lunch and at bedtime   Yes [provider]  cyclobenzaprine (FLEXERIL) 5 MG tablet Take 1 tablet (5 mg total) by mouth 3 (three) times daily as needed for muscle spasms. 07/31/21   Marguerita Beards, MD  valACYclovir (VALTREX) 1000 MG tablet TAKE 1 TABLET(1000 MG) BY MOUTH DAILY 08/29/22   Littie Deeds, MD    Family History History reviewed. No pertinent family history.  Social History Social History   Tobacco Use   Smoking status: Former    Types: Cigarettes    Quit date: 09/2019    Years since quitting: 3.3   Smokeless tobacco: Never  Vaping Use   Vaping Use: Never used  Substance Use Topics   Alcohol use: Yes    Comment: socially   Drug use: Yes    Types: Marijuana     Allergies   Codeine,  Macrobid [nitrofurantoin], and Penicillins   Review of Systems Review of Systems  Constitutional:  Negative for fever.  HENT:  Negative for sore throat.   Respiratory:  Negative for cough and shortness of breath.   Cardiovascular:  Negative for chest pain.  Gastrointestinal:  Positive for nausea. Negative for abdominal pain and vomiting.  Genitourinary:  Positive for flank pain, frequency and urgency.  Musculoskeletal:  Positive for back pain. Negative for gait problem and joint swelling.     Physical Exam Triage Vital Signs ED Triage Vitals  Enc Vitals Group     BP 01/22/23 1043 (!) 128/90     Pulse Rate 01/22/23 1043 87     Resp 01/22/23 1043 18     Temp 01/22/23 1043 98.1 F (36.7 C)     Temp Source 01/22/23 1043 Oral     SpO2 01/22/23 1043 94 %     Weight --      Height --      Head Circumference --      Peak Flow --      Pain Score 01/22/23 1045 9     Pain Loc --      Pain Edu? --      Excl. in GC? --    No data found.  Updated Vital Signs BP (!) 128/90   Pulse 87   Temp 98.1 F (36.7 C) (Oral)   Resp 18   SpO2 94%   Visual Acuity Right Eye Distance:   Left Eye Distance:   Bilateral Distance:    Right Eye Near:   Left Eye Near:    Bilateral Near:     Physical Exam Vitals and nursing note reviewed.  Constitutional:      Appearance: Normal appearance.  HENT:     Head: Normocephalic and atraumatic.     Right Ear: External ear normal.     Left Ear: External ear normal.     Nose: Nose normal.     Mouth/Throat:     Mouth: Mucous membranes are moist.  Eyes:     Conjunctiva/sclera: Conjunctivae normal.  Cardiovascular:     Rate and Rhythm: Normal rate and regular rhythm.     Heart sounds: Normal heart sounds. No murmur heard. Pulmonary:     Effort: Pulmonary effort is normal. No respiratory distress.     Breath sounds: Normal breath sounds.  Abdominal:     General: Abdomen is  flat.  Musculoskeletal:        General: Tenderness present. No  swelling.     Cervical back: Normal.     Thoracic back: Tenderness present. Decreased range of motion.     Lumbar back: Normal.       Back:     Comments: Left-sided thoracic tenderness reproducible to palpation, pain elicited with bending and twisting.  Skin:    General: Skin is warm and dry.  Neurological:     General: No focal deficit present.     Mental Status: She is alert and oriented to person, place, and time.  Psychiatric:        Mood and Affect: Mood normal.        Behavior: Behavior normal. Behavior is cooperative.      UC Treatments / Results  Labs (all labs ordered are listed, but only abnormal results are displayed) Labs Reviewed  POCT URINALYSIS DIP (MANUAL ENTRY)  POCT URINE PREGNANCY    EKG   Radiology No results found.  Procedures Procedures (including critical care time)  Medications Ordered in UC Medications  methylPREDNISolone sodium succinate (SOLU-MEDROL) 125 mg/2 mL injection 80 mg (has no administration in time range)    Initial Impression / Assessment and Plan / UC Course  I have reviewed the triage vital signs and the nursing notes.  Pertinent labs & imaging results that were available during my care of the patient were reviewed by me and considered in my medical decision making (see chart for details).  Vitals and triage reviewed, patient is hemodynamically stable.  Does have a complex medical history with MS, chronic pain, sciatica, glaucoma, left flank pain, HSV and obesity.  Unclear what is causing her symptoms today, either way sciatica and MS flare will be treated with steroids.  Urinalysis negative for infection.  Patient with irregular periods, UPT negative.  Discussed following up with providers to manage MS for further evaluation.  Plan of care, follow-up care and return precautions discussed, no questions at this time.     Final Clinical Impressions(s) / UC Diagnoses   Final diagnoses:  Strain of thoracic back region  Urinary  frequency  MS (multiple sclerosis) (HCC)     Discharge Instructions      I feel as if you are either having a flare of your sciatica, a back strain or having a flareup of your MS.  Covering you with a steroid injection in clinic, and sending you home on steroids as well.  Please take all steroids as prescribed and until finished.  Please follow-up with the doctor who manages your MS for further evaluation.  Your urine was negative for infection.  Please return to this clinic if you develop any new or concerning symptoms.      ED Prescriptions     Medication Sig Dispense Auth. Provider   predniSONE (STERAPRED UNI-PAK 21 TAB) 10 MG (21) TBPK tablet Take by mouth daily. Take 6 tabs by mouth daily  for 2 days, then 5 tabs for 2 days, then 4 tabs for 2 days, then 3 tabs for 2 days, 2 tabs for 2 days, then 1 tab by mouth daily for 2 days 42 tablet Rinaldo Ratel, [place], [place]      PDMP not reviewed this encounter.   Nissim Fleischer, [place], [place] 01/22/23 1122

## 2023-02-06 ENCOUNTER — Other Ambulatory Visit: Payer: Self-pay | Admitting: Physician Assistant

## 2023-02-06 DIAGNOSIS — Z1231 Encounter for screening mammogram for malignant neoplasm of breast: Secondary | ICD-10-CM

## 2023-02-20 ENCOUNTER — Other Ambulatory Visit: Payer: Self-pay | Admitting: Physician Assistant

## 2023-02-20 DIAGNOSIS — N6310 Unspecified lump in the right breast, unspecified quadrant: Secondary | ICD-10-CM

## 2023-02-21 ENCOUNTER — Ambulatory Visit: Payer: Medicaid Other

## 2023-04-10 ENCOUNTER — Ambulatory Visit (HOSPITAL_COMMUNITY)
Admission: EM | Admit: 2023-04-10 | Discharge: 2023-04-10 | Disposition: A | Discharge status: Home / Self Care | Payer: Medicaid Other | Attending: Family Medicine | Admitting: Family Medicine

## 2023-04-10 ENCOUNTER — Encounter (HOSPITAL_COMMUNITY): Payer: Self-pay

## 2023-04-10 ENCOUNTER — Ambulatory Visit (INDEPENDENT_AMBULATORY_CARE_PROVIDER_SITE_OTHER): Payer: Medicaid Other

## 2023-04-10 DIAGNOSIS — M25561 Pain in right knee: Secondary | ICD-10-CM

## 2023-04-10 DIAGNOSIS — M62838 Other muscle spasm: Secondary | ICD-10-CM

## 2023-04-10 LAB — POCT FASTING CBG KUC MANUAL ENTRY: POCT Glucose (KUC): 86 mg/dL (ref 70–99)

## 2023-04-10 MED ORDER — TIZANIDINE HCL 4 MG PO TABS: 4.0000 mg | ORAL_TABLET | Freq: Three times a day (TID) | ORAL | 0 refills | Status: DC | PRN

## 2023-04-10 MED ORDER — PREDNISONE 20 MG PO TABS: 40.0000 mg | ORAL_TABLET | Freq: Every day | ORAL | 0 refills | Status: AC

## 2023-04-10 NOTE — ED Triage Notes (Signed)
Pt reports her right knee is swollen and hard to walk on x 2 weeks.    Is being treated as if she has MS. States baclofen is not working.

## 2023-04-10 NOTE — ED Provider Notes (Addendum)
[facility]    CSN: [id number] Arrival date & time: 04/10/23  1826      History   Chief Complaint No chief complaint on file.   HPI Darlene Bowen is a 47 y.o. female.   HPI Here for swelling and pain in her right knee.  Is been going on the last 2 weeks or so.  No fever and no trauma   she is being evaluated for possible multiple sclerosis.  Testing has been negative for lupus and for rheumatoid arthritis.  She is taking baclofen for possible tight muscle in her back.  She states it and Flexeril have not helped.   Past Medical History:  Diagnosis Date   Glaucoma    IIH (idiopathic intracranial hypertension)    Multiple sclerosis (HCC)    Restless legs     Patient Active Problem List   Diagnosis Date Noted   Eczema 08/07/2021   Class 3 severe obesity due to excess calories with body mass index (BMI) of 40.0 to 44.9 in adult [facility]) 08/07/2021   Recurrent genital HSV (herpes simplex virus) infection 08/07/2021   Left flank pain 12/26/2020   Dysphagia 12/26/2020   Low iron 09/05/2020   DVT (deep venous thrombosis) (HCC) 02/15/2020   H/O Bell's palsy 02/15/2020   Vitamin B1 deficiency 02/15/2020   Foot pain 01/05/2018   Tailor's bunion 01/05/2018   Tobacco user 01/05/2018    History reviewed. No pertinent surgical history.  OB History     Gravida  20   Para      Term      Preterm      AB      Living  4      SAB      IAB      Ectopic      Multiple      Live Births  4            Home Medications    Prior to Admission medications   Medication Sig Start Date End Date Taking? Authorizing Provider  predniSONE (DELTASONE) 20 MG tablet Take 2 tablets (40 mg total) by mouth daily with breakfast for 5 days. 04/10/23 04/15/23 Yes Manasseh Pittsley, Janace Aris, MD  tiZANidine (ZANAFLEX) 4 MG tablet Take 1 tablet (4 mg total) by mouth every 8 (eight) hours as needed for muscle spasms. 04/10/23  Yes Zenia Resides, MD  acetaZOLAMIDE (DIAMOX) 250 MG  tablet Take 250 mg by mouth 2 (two) times daily.    [provider]  alclomethasone (ACLOVATE) 0.05 % ointment Apply topically 2 (two) times daily. 08/07/21   Lilland, Alana, DO  AMITRIPTYLINE HCL PO Take by mouth.    [provider]  Crisaborole (EUCRISA) 2 % OINT Apply 1 application topically daily as needed. 08/07/21   Lilland, Alana, DO  mirabegron ER (MYRBETRIQ) 25 MG TB24 tablet Take 1 tablet (25 mg total) by mouth daily. 07/31/21   Marguerita Beards, MD  pregabalin (LYRICA) 50 MG capsule Take 50 mg by mouth. Take 50mg  daily in the morning and 100mg  at lunch and at bedtime    [provider]  valACYclovir (VALTREX) 1000 MG tablet TAKE 1 TABLET(1000 MG) BY MOUTH DAILY 08/29/22   Littie Deeds, MD    Family History History reviewed. No pertinent family history.  Social History Social History   Tobacco Use   Smoking status: Former    Current packs/day: 0.00    Types: Cigarettes    Quit date: 09/2019    Years  since quitting: 3.6   Smokeless tobacco: Never  Vaping Use   Vaping status: Never Used  Substance Use Topics   Alcohol use: Yes    Comment: socially   Drug use: Yes    Types: Marijuana     Allergies   Codeine, Macrobid [nitrofurantoin], and Penicillins   Review of Systems Review of Systems   Physical Exam Triage Vital Signs ED Triage Vitals  Encounter Vitals Group     BP 04/10/23 1920 (!) 144/109     Systolic BP Percentile --      Diastolic BP Percentile --      Pulse Rate 04/10/23 1920 77     Resp 04/10/23 1920 18     Temp 04/10/23 1920 97.8 F (36.6 C)     Temp Source 04/10/23 1920 Oral     SpO2 04/10/23 1920 98 %     Weight --      Height --      Head Circumference --      Peak Flow --      Pain Score 04/10/23 1917 8     Pain Loc --      Pain Education --      Exclude from Growth Chart --    No data found.  Updated Vital Signs BP (!) 144/109 (BP Location: Right Arm)   Pulse 77   Temp 97.8 F (36.6 C) (Oral)    Resp 18   LMP  (LMP Unknown)   SpO2 98%   Visual Acuity Right Eye Distance:   Left Eye Distance:   Bilateral Distance:    Right Eye Near:   Left Eye Near:    Bilateral Near:     Physical Exam Vitals reviewed.  Constitutional:      General: She is not in acute distress.    Appearance: She is not ill-appearing, toxic-appearing or diaphoretic.  Musculoskeletal:     Comments: There is tenderness of the right knee at the medial joint line.  There is also some effusion.  Range of motion is still fairly good  Skin:    Coloration: Skin is not jaundiced or pale.  Neurological:     General: No focal deficit present.     Mental Status: She is alert and oriented to person, place, and time.  Psychiatric:        Behavior: Behavior normal.      UC Treatments / Results  Labs (all labs ordered are listed, but only abnormal results are displayed) Labs Reviewed  POCT FASTING [facility] MANUAL ENTRY    EKG   Radiology DG Knee AP/LAT W/Sunrise Right  Result Date: 04/10/2023 CLINICAL DATA:  Right knee pain and swelling. EXAM: RIGHT KNEE 3 VIEWS COMPARISON:  None Available. FINDINGS: No evidence of fracture, dislocation, or joint effusion. No evidence of arthropathy or other focal bone abnormality. Soft tissues are unremarkable. IMPRESSION: Negative. Electronically Signed   By: Elgie Collard M.D.   On: 04/10/2023 19:53    Procedures Procedures (including critical care time)  Medications Ordered in UC Medications - No data to display  Initial Impression / Assessment and Plan / UC Course  I have reviewed the triage vital signs and the nursing notes.  Pertinent labs & imaging results that were available during my care of the patient were reviewed by me and considered in my medical decision making (see chart for details).        Xray of the knee is negative. Short burst of prednisone is sent in  to help, and knee sleeve provided.   Tizanidine has been sent for muscle spasm   As  I was getting patient's discharge papers ready, she noted to staff that she had some dizziness and blurry vision.  The blurry vision is improved some and so is the dizziness when I go talk to her.  Glucose is 86.  She does state that it is about time for her to take her Diamox.  She has not missed any doses of that or her latanoprost.   Final Clinical Impressions(s) / UC Diagnoses   Final diagnoses:  Acute pain of right knee  Muscle spasm     Discharge Instructions      Your knee x-ray does not show any bony abnormalities at this time.  Take prednisone 20 mg--2 daily for 5 days   Take tizanidine 4 mg--1 every 8 hours as needed for muscle spasms; this medication can cause dizziness and sleepiness       ED Prescriptions     Medication Sig Dispense Auth. Provider   predniSONE (DELTASONE) 20 MG tablet Take 2 tablets (40 mg total) by mouth daily with breakfast for 5 days. 10 tablet Zenia Resides, MD   tiZANidine (ZANAFLEX) 4 MG tablet Take 1 tablet (4 mg total) by mouth every 8 (eight) hours as needed for muscle spasms. 15 tablet Brooklee Michelin, Janace Aris, MD      PDMP not reviewed this encounter.   Zenia Resides, MD 04/10/23 2007    Zenia Resides, MD 04/10/23 2013

## 2023-04-10 NOTE — Discharge Instructions (Signed)
Your knee x-ray does not show any bony abnormalities at this time.  Take prednisone 20 mg--2 daily for 5 days   Take tizanidine 4 mg--1 every 8 hours as needed for muscle spasms; this medication can cause dizziness and sleepiness

## 2023-05-22 ENCOUNTER — Other Ambulatory Visit: Payer: Self-pay | Admitting: General Practice

## 2023-05-22 DIAGNOSIS — N6310 Unspecified lump in the right breast, unspecified quadrant: Secondary | ICD-10-CM

## 2023-05-23 ENCOUNTER — Other Ambulatory Visit: Payer: Medicaid Other

## 2023-05-28 ENCOUNTER — Other Ambulatory Visit: Payer: Medicaid Other

## 2023-06-19 ENCOUNTER — Ambulatory Visit
Admission: RE | Admit: 2023-06-19 | Discharge: 2023-06-19 | Disposition: A | Discharge status: Home / Self Care | Payer: Medicaid Other | Source: Ambulatory Visit | Attending: Physician Assistant | Admitting: Physician Assistant

## 2023-06-19 DIAGNOSIS — N6310 Unspecified lump in the right breast, unspecified quadrant: Secondary | ICD-10-CM

## 2023-07-08 ENCOUNTER — Ambulatory Visit (HOSPITAL_COMMUNITY)
Admission: EM | Admit: 2023-07-08 | Discharge: 2023-07-08 | Disposition: A | Discharge status: Home / Self Care | Payer: Medicaid Other | Attending: Internal Medicine | Admitting: Internal Medicine

## 2023-07-08 ENCOUNTER — Encounter (HOSPITAL_COMMUNITY): Payer: Self-pay

## 2023-07-08 DIAGNOSIS — M7918 Myalgia, other site: Secondary | ICD-10-CM | POA: Insufficient documentation

## 2023-07-08 DIAGNOSIS — J029 Acute pharyngitis, unspecified: Secondary | ICD-10-CM | POA: Diagnosis present

## 2023-07-08 LAB — POCT RAPID STREP A (OFFICE): Rapid Strep A Screen: NEGATIVE

## 2023-07-08 MED ORDER — PREDNISONE 50 MG PO TABS: ORAL_TABLET | ORAL | 0 refills | Status: DC

## 2023-07-08 NOTE — ED Triage Notes (Signed)
Pt states sore throat for the past 4 days. 

## 2023-07-08 NOTE — ED Provider Notes (Signed)
[facility]    CSN: [id number] Arrival date & time: 07/08/23  1705      History   Chief Complaint Chief Complaint  Patient presents with   Sore Throat    HPI Darlene Bowen is a 47 y.o. female.   Patient complains of bodyaches and discomfort.  Patient reports that she has a sore throat.  Patient reports her son has had a sore throat as well.  Pt reports she has MS with multiple lesions.  Pt request prednsione.  Pt reports she is changing neurologist.  Patient denies having any fever or chills.  Patient reports she has had comfort with swallowing but she is able to drink without any difficulty.  Patient denies any cough or shortness of breath.   Sore Throat    Past Medical History:  Diagnosis Date   Glaucoma    IIH (idiopathic intracranial hypertension)    Multiple sclerosis (HCC)    Restless legs     Patient Active Problem List   Diagnosis Date Noted   Eczema 08/07/2021   Class 3 severe obesity due to excess calories with body mass index (BMI) of 40.0 to 44.9 in adult [facility]) 08/07/2021   Recurrent genital HSV (herpes simplex virus) infection 08/07/2021   Left flank pain 12/26/2020   Dysphagia 12/26/2020   Low iron 09/05/2020   DVT (deep venous thrombosis) (HCC) 02/15/2020   H/O Bell's palsy 02/15/2020   Vitamin B1 deficiency 02/15/2020   Foot pain 01/05/2018   Tailor's bunion 01/05/2018   Tobacco user 01/05/2018    History reviewed. No pertinent surgical history.  OB History     Gravida  20   Para      Term      Preterm      AB      Living  4      SAB      IAB      Ectopic      Multiple      Live Births  4            Home Medications    Prior to Admission medications   Medication Sig Start Date End Date Taking? Authorizing Provider  predniSONE (DELTASONE) 50 MG tablet One tablet a day 07/08/23  Yes Elson Areas, PA-C  acetaZOLAMIDE (DIAMOX) 250 MG tablet Take 250 mg by mouth 2 (two) times daily.    [provider]  alclomethasone (ACLOVATE) 0.05 % ointment Apply topically 2 (two) times daily. 08/07/21   Lilland, Alana, DO  AMITRIPTYLINE HCL PO Take by mouth.    [provider]  Crisaborole (EUCRISA) 2 % OINT Apply 1 application topically daily as needed. 08/07/21   Lilland, Alana, DO  mirabegron ER (MYRBETRIQ) 25 MG TB24 tablet Take 1 tablet (25 mg total) by mouth daily. 07/31/21   Marguerita Beards, MD  pregabalin (LYRICA) 50 MG capsule Take 50 mg by mouth. Take 50mg  daily in the morning and 100mg  at lunch and at bedtime    [provider]  tiZANidine (ZANAFLEX) 4 MG tablet Take 1 tablet (4 mg total) by mouth every 8 (eight) hours as needed for muscle spasms. 04/10/23   Zenia Resides, MD  valACYclovir (VALTREX) 1000 MG tablet TAKE 1 TABLET(1000 MG) BY MOUTH DAILY 08/29/22   Littie Deeds, MD    Family History History reviewed. No pertinent family history.  Social History Social History   Tobacco Use   Smoking status: Former    Current packs/day: 0.00  Types: Cigarettes    Quit date: 09/2019    Years since quitting: 3.8   Smokeless tobacco: Never  Vaping Use   Vaping status: Never Used  Substance Use Topics   Alcohol use: Yes    Comment: socially   Drug use: Yes    Types: Marijuana     Allergies   Codeine, Macrobid [nitrofurantoin], and Penicillins   Review of Systems Review of Systems  All other systems reviewed and are negative.    Physical Exam Triage Vital Signs ED Triage Vitals  Encounter Vitals Group     BP 07/08/23 1844 (!) 148/111     Systolic BP Percentile --      Diastolic BP Percentile --      Pulse Rate 07/08/23 1844 (!) 105     Resp 07/08/23 1844 16     Temp 07/08/23 1844 98.3 F (36.8 C)     Temp Source 07/08/23 1844 Oral     SpO2 07/08/23 1844 97 %     Weight --      Height --      Head Circumference --      Peak Flow --      Pain Score 07/08/23 1846 9     Pain Loc --      Pain Education --      Exclude from  Growth Chart --    No data found.  Updated Vital Signs BP (!) 148/111 (BP Location: Left Arm)   Pulse (!) 105   Temp 98.3 F (36.8 C) (Oral)   Resp 16   SpO2 97%   Visual Acuity Right Eye Distance:   Left Eye Distance:   Bilateral Distance:    Right Eye Near:   Left Eye Near:    Bilateral Near:     Physical Exam Vitals and nursing note reviewed.  Constitutional:      Appearance: She is well-developed.  HENT:     Head: Normocephalic.     Right Ear: Tympanic membrane normal.     Left Ear: Tympanic membrane normal.     Mouth/Throat:     Mouth: Mucous membranes are moist.     Pharynx: Posterior oropharyngeal erythema present.  Cardiovascular:     Rate and Rhythm: Normal rate.  Pulmonary:     Effort: Pulmonary effort is normal.  Abdominal:     General: There is no distension.  Musculoskeletal:        General: Normal range of motion.     Cervical back: Normal range of motion.  Skin:    General: Skin is warm.  Neurological:     General: No focal deficit present.     Mental Status: She is alert and oriented to person, place, and time.      UC Treatments / Results  Labs (all labs ordered are listed, but only abnormal results are displayed) Labs Reviewed  CULTURE, GROUP A STREP [facility])  POCT RAPID STREP A (OFFICE)    EKG   Radiology No results found.  Procedures Procedures (including critical care time)  Medications Ordered in UC Medications - No data to display  Initial Impression / Assessment and Plan / UC Course  I have reviewed the triage vital signs and the nursing notes.  Pertinent labs & imaging results that were available during my care of the patient were reviewed by me and considered in my medical decision making (see chart for details).     Strep screen is negative culture is pending.  Patient may have  viral pharyngitis.  Is also could be from MS.  I will give patient a prescription for prednisone to attempt to help with her  symptoms. Final Clinical Impressions(s) / UC Diagnoses   Final diagnoses:  Pharyngitis, unspecified etiology  Myalgia, multiple sites     Discharge Instructions      Follow up with your Physician for recheck.  Return if any problems.    ED Prescriptions     Medication Sig Dispense Auth. Provider   predniSONE (DELTASONE) 50 MG tablet One tablet a day 5 tablet Elson Areas, [place]      PDMP not reviewed this encounter.   Elson Areas, [place] 07/08/23 2004

## 2023-07-08 NOTE — Discharge Instructions (Addendum)
Follow up with your Physician for recheck.  Return if any problems.  

## 2023-07-12 LAB — CULTURE, GROUP A STREP (THRC)

## 2023-07-28 ENCOUNTER — Other Ambulatory Visit (HOSPITAL_COMMUNITY): Payer: Self-pay | Admitting: Physician Assistant

## 2023-07-28 DIAGNOSIS — E041 Nontoxic single thyroid nodule: Secondary | ICD-10-CM

## 2023-08-08 ENCOUNTER — Ambulatory Visit (HOSPITAL_COMMUNITY)
Admission: RE | Admit: 2023-08-08 | Discharge: 2023-08-08 | Disposition: A | Discharge status: Home / Self Care | Payer: Medicaid Other | Source: Ambulatory Visit | Attending: Physician Assistant

## 2023-08-08 DIAGNOSIS — E041 Nontoxic single thyroid nodule: Secondary | ICD-10-CM | POA: Insufficient documentation

## 2023-08-08 MED ORDER — LIDOCAINE HCL (PF) 1 % IJ SOLN: 3.0000 mL | Freq: Once | INTRAMUSCULAR | Status: DC

## 2023-09-25 ENCOUNTER — Other Ambulatory Visit (HOSPITAL_COMMUNITY): Payer: Self-pay | Admitting: Physician Assistant

## 2023-09-25 DIAGNOSIS — E041 Nontoxic single thyroid nodule: Secondary | ICD-10-CM

## 2023-09-28 NOTE — Progress Notes (Unsigned)
Office Visit Note   Patient: Darlene Bowen           Date of Birth: Aug 21, 1976           MRN: [id number] Visit Date: 09/30/2023              Requested by: Tomasa Rand, PA [address] [place],  [place] [address] PCP: Tomasa Rand, [place]   Assessment & Plan: Visit Diagnoses:  1. Chronic pain of right knee     Plan: Darlene Bowen is a 48 year old female with right knee pain with a small effusion.  X-rays from PACS independently reviewed and interpreted does not show any acute abnormalities or degenerative changes.  Treatment options are given and she would like to try prednisone Dosepak which I have sent in.  She will follow-up with this does not resolve her symptoms.  Follow-Up Instructions: No follow-ups on file.   Orders:  No orders of the defined types were placed in this encounter.  Meds ordered this encounter  Medications   predniSONE (STERAPRED UNI-PAK 21 TAB) 10 MG (21) TBPK tablet    Sig: Take as directed    Dispense:  21 tablet    Refill:  3      Procedures: No procedures performed   Clinical Data: No additional findings.   Subjective: Chief Complaint  Patient presents with   Right Knee - Pain    HPI Patient is a 48 year old female here for right knee pain for about 3 weeks.  Originally had worse swelling but feels like the swelling has gotten better.  Currently being treated for MS.  Reports lateral parapatellar pain that is worse with getting up.  Denies any mechanical symptoms. Review of Systems  Constitutional: Negative.   HENT: Negative.    Eyes: Negative.   Respiratory: Negative.    Cardiovascular: Negative.   Endocrine: Negative.   Musculoskeletal: Negative.   Neurological: Negative.   Hematological: Negative.   Psychiatric/Behavioral: Negative.    All other systems reviewed and are negative.    Objective: Vital Signs: There were no vitals taken for this visit.  Physical Exam Vitals and nursing note reviewed.  Constitutional:       Appearance: She is well-developed.  HENT:     Head: Atraumatic.     Nose: Nose normal.  Eyes:     Extraocular Movements: Extraocular movements intact.  Cardiovascular:     Pulses: Normal pulses.  Pulmonary:     Effort: Pulmonary effort is normal.  Abdominal:     Palpations: Abdomen is soft.  Musculoskeletal:     Cervical back: Neck supple.  Skin:    General: Skin is warm.     Capillary Refill: Capillary refill takes less than 2 seconds.  Neurological:     Mental Status: She is alert. Mental status is at baseline.  Psychiatric:        Behavior: Behavior normal.        Thought Content: Thought content normal.        Judgment: Judgment normal.    Ortho Exam Examination of the right knee shows small residual effusion.  Normal range of motion.  No focal findings otherwise. Specialty Comments:  No specialty comments available.  Imaging: No results found.   PMFS History: Patient Active Problem List   Diagnosis Date Noted   Eczema 08/07/2021   Class 3 severe obesity due to excess calories with body mass index (BMI) of 40.0 to 44.9 in adult [facility]) 08/07/2021   Recurrent genital  HSV (herpes simplex virus) infection 08/07/2021   Left flank pain 12/26/2020   Dysphagia 12/26/2020   Low iron 09/05/2020   DVT (deep venous thrombosis) (HCC) 02/15/2020   H/O Bell's palsy 02/15/2020   Vitamin B1 deficiency 02/15/2020   Foot pain 01/05/2018   Tailor's bunion 01/05/2018   Tobacco user 01/05/2018   Past Medical History:  Diagnosis Date   Glaucoma    IIH (idiopathic intracranial hypertension)    Multiple sclerosis (HCC)    Restless legs     No family history on file.  History reviewed. No pertinent surgical history. Social History   Occupational History   Not on file  Tobacco Use   Smoking status: Former    Current packs/day: 0.00    Types: Cigarettes    Quit date: 09/2019    Years since quitting: 4.0   Smokeless tobacco: Never  Vaping Use   Vaping status: Never Used   Substance and Sexual Activity   Alcohol use: Yes    Comment: socially   Drug use: Yes    Types: Marijuana   Sexual activity: Not Currently

## 2023-09-30 ENCOUNTER — Encounter: Payer: Self-pay | Admitting: Orthopaedic Surgery

## 2023-09-30 ENCOUNTER — Ambulatory Visit: Payer: Medicaid Other | Admitting: Orthopaedic Surgery

## 2023-09-30 DIAGNOSIS — G8929 Other chronic pain: Secondary | ICD-10-CM | POA: Diagnosis not present

## 2023-09-30 DIAGNOSIS — M25561 Pain in right knee: Secondary | ICD-10-CM | POA: Diagnosis not present

## 2023-09-30 MED ORDER — PREDNISONE 10 MG (21) PO TBPK: ORAL_TABLET | ORAL | 3 refills | Status: DC

## 2023-10-16 ENCOUNTER — Other Ambulatory Visit (HOSPITAL_COMMUNITY): Payer: Self-pay | Admitting: Physician Assistant

## 2023-10-16 ENCOUNTER — Ambulatory Visit (HOSPITAL_COMMUNITY)
Admission: RE | Admit: 2023-10-16 | Discharge: 2023-10-16 | Disposition: A | Discharge status: Home / Self Care | Payer: Medicaid Other | Source: Ambulatory Visit | Attending: Physician Assistant | Admitting: Physician Assistant

## 2023-10-16 DIAGNOSIS — E042 Nontoxic multinodular goiter: Secondary | ICD-10-CM | POA: Insufficient documentation

## 2023-10-16 MED ORDER — LIDOCAINE HCL (PF) 1 % IJ SOLN
10.0000 mL | Freq: Once | INTRAMUSCULAR | Status: AC
  Administered 2023-10-16: 10 mL via INTRADERMAL

## 2023-10-20 LAB — CYTOLOGY - NON PAP

## 2023-10-24 ENCOUNTER — Ambulatory Visit: Payer: Medicaid Other | Admitting: Orthopaedic Surgery

## 2023-10-24 DIAGNOSIS — G8929 Other chronic pain: Secondary | ICD-10-CM | POA: Diagnosis not present

## 2023-10-24 DIAGNOSIS — M25562 Pain in left knee: Secondary | ICD-10-CM | POA: Diagnosis not present

## 2023-10-24 DIAGNOSIS — M25561 Pain in right knee: Secondary | ICD-10-CM | POA: Diagnosis not present

## 2023-10-24 MED ORDER — DICLOFENAC SODIUM 75 MG PO TBEC: 75.0000 mg | DELAYED_RELEASE_TABLET | Freq: Two times a day (BID) | ORAL | 1 refills | Status: DC | PRN

## 2023-10-24 NOTE — Progress Notes (Signed)
 Office Visit Note   Patient: Darlene Bowen           Date of Birth: July 10, 1976           MRN: [id number] Visit Date: 10/24/2023              Requested by: Tomasa Rand, PA [address] [place],  [place] [address] PCP: Tomasa Rand, [place]   Assessment & Plan: Visit Diagnoses:  1. Chronic pain of both knees     Plan: Impression is chronic bilateral knee pain.  We have discussed cortisone injection versus NSAIDs and PT.  She is not interested in cortisone injections at this time.  Have sent in a referral for outpatient physical therapy and if sent in a prescription for Voltaren.  She will follow-up with [place] as needed.  Call with concerns or questions.  Follow-Up Instructions: Return if symptoms worsen or fail to improve.   Orders:  Orders Placed This Encounter  Procedures   Ambulatory referral to Physical Therapy   Meds ordered this encounter  Medications   diclofenac (VOLTAREN) 75 MG EC tablet    Sig: Take 1 tablet (75 mg total) by mouth 2 (two) times daily as needed.    Dispense:  60 tablet    Refill:  1      Procedures: No procedures performed   Clinical Data: No additional findings.   Subjective: Chief Complaint  Patient presents with   Right Knee - Pain   Left Knee - Pain    HPI patient is a pleasant 48 year old female who comes in today with chronic bilateral knee pain right greater than left.  Symptoms began several months ago without any injury or change in activity.  She feels more of a pressure than actual pain although she does admit to constant soreness.  Pain appears to be worse when she is squatting or kneeling.  She has been taking muscle relaxers as well as gabapentin without relief.  She was seen by [place] back in January where she was started on prednisone.  She did not notice any relief from this.  She has not been to physical therapy.  Review of Systems  Constitutional: Negative.   HENT: Negative.    Eyes: Negative.   Respiratory: Negative.     Cardiovascular: Negative.   Endocrine: Negative.   Musculoskeletal:  Positive for arthralgias.  Neurological: Negative.   Hematological: Negative.   Psychiatric/Behavioral: Negative.    All other systems reviewed and are negative.  as detailed in HPI.  All others reviewed and are negative.   Objective: Vital Signs: There were no vitals taken for this visit.  Physical Exam Vitals and nursing note reviewed.  Constitutional:      Appearance: She is well-developed.  HENT:     Head: Atraumatic.     Nose: Nose normal.  Eyes:     Extraocular Movements: Extraocular movements intact.  Cardiovascular:     Pulses: Normal pulses.  Pulmonary:     Effort: Pulmonary effort is normal.  Abdominal:     Palpations: Abdomen is soft.  Musculoskeletal:     Cervical back: Neck supple.  Skin:    General: Skin is warm.     Capillary Refill: Capillary refill takes less than 2 seconds.  Neurological:     Mental Status: She is alert. Mental status is at baseline.  Psychiatric:        Behavior: Behavior normal.        Thought Content: Thought content  normal.        Judgment: Judgment normal.    well-developed well-nourished female no acute distress.  Alert and oriented x 3.  Ortho Exam right knee exam: Lateral joint line tenderness.  Tenderness along the medial and lateral patellar facet.  No patellofemoral crepitus.  Range of motion 0 to 120 degrees.  She is stable to valgus varus stress.  Left knee exam: Medial joint line tenderness as well as tenderness to the medial patellar facet.  No patellofemoral crepitus.  Range of motion 0 to 120 degrees.  Ligaments are stable.  She is neurovascularly intact distally.  Specialty Comments:  No specialty comments available.  Imaging: No new imaging   PMFS History: Patient Active Problem List   Diagnosis Date Noted   Eczema 08/07/2021   Class 3 severe obesity due to excess calories with body mass index (BMI) of 40.0 to 44.9 in adult (HCC)  08/07/2021   Recurrent genital HSV (herpes simplex virus) infection 08/07/2021   Left flank pain 12/26/2020   Dysphagia 12/26/2020   Low iron 09/05/2020   DVT (deep venous thrombosis) (HCC) 02/15/2020   H/O Bell's palsy 02/15/2020   Vitamin B1 deficiency 02/15/2020   Foot pain 01/05/2018   Tailor's bunion 01/05/2018   Tobacco user 01/05/2018   Past Medical History:  Diagnosis Date   Glaucoma    IIH (idiopathic intracranial hypertension)    Multiple sclerosis (HCC)    Restless legs     No family history on file.  No past surgical history on file. Social History   Occupational History   Not on file  Tobacco Use   Smoking status: Former    Current packs/day: 0.00    Types: Cigarettes    Quit date: 09/2019    Years since quitting: 4.1   Smokeless tobacco: Never  Vaping Use   Vaping status: Never Used  Substance and Sexual Activity   Alcohol use: Yes    Comment: socially   Drug use: Yes    Types: Marijuana   Sexual activity: Not Currently

## 2023-11-03 ENCOUNTER — Telehealth: Payer: Self-pay | Admitting: Physician Assistant

## 2023-11-03 ENCOUNTER — Telehealth: Payer: Self-pay

## 2023-11-03 ENCOUNTER — Other Ambulatory Visit: Payer: Self-pay | Admitting: Physician Assistant

## 2023-11-03 MED ORDER — IBUPROFEN 600 MG PO TABS: 600.0000 mg | ORAL_TABLET | Freq: Three times a day (TID) | ORAL | 1 refills | Status: DC | PRN

## 2023-11-03 NOTE — Telephone Encounter (Signed)
 Called patient. No answer. LMOM that [facility] sent in Ibuprofen 600mg  to [facility] on [address]. Insurance did not want to cover Diclofenac that was previously sent in.

## 2023-11-18 ENCOUNTER — Telehealth: Payer: Self-pay | Admitting: Orthopaedic Surgery

## 2023-11-18 NOTE — Telephone Encounter (Signed)
 Received call from [facility]/referring office. Requesting notes. I faxed to [phone]

## 2023-12-03 ENCOUNTER — Encounter (HOSPITAL_COMMUNITY): Payer: Self-pay

## 2023-12-03 ENCOUNTER — Ambulatory Visit (HOSPITAL_COMMUNITY): Admission: EM | Admit: 2023-12-03 | Discharge: 2023-12-03 | Disposition: A | Discharge status: Home / Self Care

## 2023-12-03 ENCOUNTER — Ambulatory Visit (HOSPITAL_COMMUNITY)
Admission: RE | Admit: 2023-12-03 | Discharge: 2023-12-03 | Disposition: A | Discharge status: Home / Self Care | Source: Ambulatory Visit | Attending: Family Medicine | Admitting: Family Medicine

## 2023-12-03 ENCOUNTER — Encounter (HOSPITAL_COMMUNITY): Payer: Self-pay | Admitting: *Deleted

## 2023-12-03 ENCOUNTER — Other Ambulatory Visit: Payer: Self-pay

## 2023-12-03 DIAGNOSIS — M7989 Other specified soft tissue disorders: Secondary | ICD-10-CM

## 2023-12-03 MED ORDER — KETOROLAC TROMETHAMINE 60 MG/2ML IM SOLN
60.0000 mg | Freq: Once | INTRAMUSCULAR | Status: AC
  Administered 2023-12-03: 60 mg via INTRAMUSCULAR

## 2023-12-03 MED ORDER — MELOXICAM 15 MG PO TABS: 15.0000 mg | ORAL_TABLET | Freq: Every day | ORAL | 0 refills | Status: DC

## 2023-12-03 MED ORDER — KETOROLAC TROMETHAMINE 60 MG/2ML IM SOLN
INTRAMUSCULAR | Status: AC
  Filled 2023-12-03: qty 2

## 2023-12-03 NOTE — ED Provider Notes (Addendum)
 [facility]   Note:  This document was prepared using Dragon voice recognition software and may include unintentional dictation errors.  MRN: [id number] DOB: August 11, 1976  Subjective:   Darlene Bowen is a 48 y.o. female presenting for right knee pain and swelling x 1 month.  Patient reports that she has been evaluated by orthopedics prior to today but they advised her that there is nothing structurally wrong with her knee or lower leg.  Patient reports swelling to ankle and foot of the right leg.  Patient states that most of the swelling and pain is localized around her knee.  Patient has been taking Tylenol and ibuprofen with no improvement of symptoms.  Patient is concern for DVT blood clot as she had a blood clot in the left leg previously.  Denies any redness, warmth, pain to the lower leg.    No current facility-administered medications for this encounter.  Current Outpatient Medications:    AMITRIPTYLINE HCL PO, Take by mouth., Disp: , Rfl:    pregabalin (LYRICA) 50 MG capsule, Take 50 mg by mouth. Take 50mg  daily in the morning and 100mg  at lunch and at bedtime, Disp: , Rfl:    alclomethasone (ACLOVATE) 0.05 % ointment, Apply topically 2 (two) times daily., Disp: 45 g, Rfl: 0   Crisaborole (EUCRISA) 2 % OINT, Apply 1 application topically daily as needed., Disp: 100 g, Rfl: 2   mirabegron ER (MYRBETRIQ) 25 MG TB24 tablet, Take 1 tablet (25 mg total) by mouth daily., Disp: 30 tablet, Rfl: 5   valACYclovir (VALTREX) 1000 MG tablet, TAKE 1 TABLET(1000 MG) BY MOUTH DAILY, Disp: 90 tablet, Rfl: 0   Allergies  Allergen Reactions   Codeine Anaphylaxis   Macrobid [Nitrofurantoin] Anaphylaxis   Penicillins Swelling    Past Medical History:  Diagnosis Date   Glaucoma    IIH (idiopathic intracranial hypertension)    Multiple sclerosis (HCC)    Restless legs      History reviewed. No pertinent surgical history.  History reviewed. No pertinent family history.  Social  History   Tobacco Use   Smoking status: Former    Current packs/day: 0.00    Types: Cigarettes    Quit date: 09/2019    Years since quitting: 4.2   Smokeless tobacco: Never  Vaping Use   Vaping status: Never Used  Substance Use Topics   Alcohol use: Yes    Comment: socially   Drug use: Yes    Types: Marijuana    ROS Refer to HPI for ROS details.  Objective:   Vitals: BP (!) 141/93   Pulse 94   Temp 98.2 F (36.8 C)   Resp 18   SpO2 98%   Physical Exam Vitals and nursing note reviewed.  Constitutional:      General: She is not in acute distress.    Appearance: Normal appearance. She is not ill-appearing or toxic-appearing.  HENT:     Head: Normocephalic.  Cardiovascular:     Rate and Rhythm: Normal rate.  Pulmonary:     Effort: Respiratory distress present.  Musculoskeletal:     Right knee: Swelling and bony tenderness present. No deformity or erythema. Decreased range of motion. Tenderness present. Normal pulse.     Right lower leg: Swelling present. No tenderness or bony tenderness. No edema.  Skin:    General: Skin is warm and dry.     Capillary Refill: Capillary refill takes less than 2 seconds.  Neurological:     General: No focal deficit present.  Mental Status: She is alert and oriented to person, place, and time.  Psychiatric:        Mood and Affect: Mood normal.        Behavior: Behavior normal.     Procedures  No results found for this or any previous visit (from the past 24 hours).  Assessment and Plan :   PDMP not reviewed this encounter.  1. Right leg swelling (Primary) - VAS [place] LOWER EXTREMITY VENOUS (DVT) (ONLY [facility] & [facility]) - ketorolac (TORADOL) injection 60 mg given in UC for right knee pain -Go to [facility] entrance see for DVT ultrasound after discharge from urgent care. -Continue to monitor symptoms for any change in severity if there is any escalation of current symptoms or development of new symptoms follow-up in ER for  further evaluation and management.  [place]   [place], Diagonal B, NP 12/03/23 1537    Pola Corn B, NP 12/03/23 1538

## 2023-12-03 NOTE — ED Triage Notes (Signed)
 PT reports swelling above RT knee and pain in Rt leg. Pt has gone to PCP and was referred to ortho that she saw. Pt is now 2 months with pain and swelling. PT is worried about  blood clott.

## 2023-12-03 NOTE — ED Notes (Signed)
 Appt. Made fro Pt at [facility] .

## 2023-12-03 NOTE — Discharge Instructions (Addendum)
 Go to [facility] entrance see for DVT ultrasound after discharge from urgent care.

## 2023-12-03 NOTE — Progress Notes (Signed)
 Lower extremity venous duplex completed. Please see CV Procedures for preliminary results.  Shona Simpson, RVT 12/03/23 4:21 PM

## 2023-12-10 NOTE — Therapy (Signed)
 OUTPATIENT PHYSICAL THERAPY LOWER EXTREMITY EVALUATION   Patient Name: Darlene Bowen MRN: [id number] DOB:08-21-1976, 48 y.o., female Today's Date: 12/10/2023  END OF SESSION:   Past Medical History:  Diagnosis Date   Glaucoma    IIH (idiopathic intracranial hypertension)    Multiple sclerosis (HCC)    Restless legs    No past surgical history on file. Patient Active Problem List   Diagnosis Date Noted   Eczema 08/07/2021   Class 3 severe obesity due to excess calories with body mass index (BMI) of 40.0 to 44.9 in adult [facility]) 08/07/2021   Recurrent genital HSV (herpes simplex virus) infection 08/07/2021   Left flank pain 12/26/2020   Dysphagia 12/26/2020   Low iron 09/05/2020   DVT (deep venous thrombosis) (HCC) 02/15/2020   H/O Bell's palsy 02/15/2020   Vitamin B1 deficiency 02/15/2020   Foot pain 01/05/2018   Tailor's bunion 01/05/2018   Tobacco user 01/05/2018    PCP: Tomasa Rand, PA   REFERRING PROVIDER: Cristie Hem, PA-C   REFERRING DIAG: M25.561,M25.562,G89.29 (ICD-10-CM) - Chronic pain of both knees   THERAPY DIAG:  No diagnosis found.  Rationale for Evaluation and Treatment: Rehabilitation  ONSET DATE: ***  SUBJECTIVE:   SUBJECTIVE STATEMENT: ***  PERTINENT HISTORY: *** PAIN:  Are you having pain? {OPRCPAIN:27236}  PRECAUTIONS: {Therapy precautions:24002}  RED FLAGS: {PT Red Flags:29287}   WEIGHT BEARING RESTRICTIONS: {Yes ***/No:24003}  FALLS:  Has patient fallen in last 6 months? {fallsyesno:27318}  LIVING ENVIRONMENT: Lives with: {OPRC lives with:25569::"lives with their family"} Lives in: {Lives in:25570} Stairs: {opstairs:27293} Has following equipment at home: {Assistive devices:23999}  OCCUPATION: ***  PLOF: {PLOF:24004}  PATIENT GOALS: ***  NEXT MD VISIT: ***  OBJECTIVE:  Note: Objective measures were completed at Evaluation unless otherwise noted.  DIAGNOSTIC FINDINGS: ***  PATIENT SURVEYS:  {rehab  surveys:24030}  COGNITION: Overall cognitive status: {cognition:24006}     SENSATION: {sensation:27233}  EDEMA:  {edema:24020}  MUSCLE LENGTH: Hamstrings: Right *** deg; Left *** deg Maisie Fus test: Right *** deg; Left *** deg  POSTURE: {posture:25561}  PALPATION: ***  LOWER EXTREMITY ROM:  {AROM/PROM:27142} ROM Right eval Left eval  Hip flexion    Hip extension    Hip abduction    Hip adduction    Hip internal rotation    Hip external rotation    Knee flexion    Knee extension    Ankle dorsiflexion    Ankle plantarflexion    Ankle inversion    Ankle eversion     (Blank rows = not tested)  LOWER EXTREMITY MMT:  MMT Right eval Left eval  Hip flexion    Hip extension    Hip abduction    Hip adduction    Hip internal rotation    Hip external rotation    Knee flexion    Knee extension    Ankle dorsiflexion    Ankle plantarflexion    Ankle inversion    Ankle eversion     (Blank rows = not tested)  LOWER EXTREMITY SPECIAL TESTS:  {LEspecialtests:26242}  FUNCTIONAL TESTS:  {Functional tests:24029}  GAIT: Distance walked: *** Assistive device utilized: {Assistive devices:23999} Level of assistance: {Levels of assistance:24026} Comments: ***  TREATMENT DATE: ***    PATIENT EDUCATION:  Education details: *** Person educated: {Person educated:25204} Education method: {Education Method:25205} Education comprehension: {Education Comprehension:25206}  HOME EXERCISE PROGRAM: ***  ASSESSMENT:  CLINICAL IMPRESSION: Patient is a *** y.o. *** who was seen today for physical therapy evaluation and treatment for ***.   OBJECTIVE IMPAIRMENTS: {opptimpairments:25111}.   ACTIVITY LIMITATIONS: {activitylimitations:27494}  PARTICIPATION LIMITATIONS: {participationrestrictions:25113}  PERSONAL FACTORS: {Personal factors:25162}  are also affecting patient's functional outcome.   REHAB POTENTIAL: {rehabpotential:25112}  CLINICAL DECISION MAKING: {clinical decision making:25114}  EVALUATION COMPLEXITY: {Evaluation complexity:25115}   GOALS: Goals reviewed with patient? {yes/no:20286}  SHORT TERM GOALS: Target date: *** *** Baseline: Goal status: INITIAL  2.  *** Baseline:  Goal status: INITIAL  3.  *** Baseline:  Goal status: INITIAL  4.  *** Baseline:  Goal status: INITIAL  5.  *** Baseline:  Goal status: INITIAL  6.  *** Baseline:  Goal status: INITIAL  LONG TERM GOALS: Target date: ***  *** Baseline:  Goal status: INITIAL  2.  *** Baseline:  Goal status: INITIAL  3.  *** Baseline:  Goal status: INITIAL  4.  *** Baseline:  Goal status: INITIAL  5.  *** Baseline:  Goal status: INITIAL  6.  *** Baseline:  Goal status: INITIAL   PLAN:  PT FREQUENCY: {rehab frequency:25116}  PT DURATION: {rehab duration:25117}  PLANNED INTERVENTIONS: {rehab planned interventions:25118::"97110-Therapeutic exercises","97530- Therapeutic [id number]- Neuromuscular re-education","97535- Self AYTK","16010- Manual therapy"}  PLAN FOR NEXT SESSION: ***   ***

## 2023-12-11 ENCOUNTER — Other Ambulatory Visit: Payer: Self-pay

## 2023-12-11 ENCOUNTER — Encounter: Payer: Self-pay | Admitting: Physical Therapy

## 2023-12-11 ENCOUNTER — Ambulatory Visit: Attending: Physician Assistant | Admitting: Physical Therapy

## 2023-12-11 DIAGNOSIS — M25562 Pain in left knee: Secondary | ICD-10-CM | POA: Diagnosis present

## 2023-12-11 DIAGNOSIS — R262 Difficulty in walking, not elsewhere classified: Secondary | ICD-10-CM | POA: Diagnosis present

## 2023-12-11 DIAGNOSIS — M25561 Pain in right knee: Secondary | ICD-10-CM | POA: Diagnosis present

## 2023-12-11 DIAGNOSIS — G8929 Other chronic pain: Secondary | ICD-10-CM | POA: Diagnosis present

## 2023-12-11 DIAGNOSIS — M6281 Muscle weakness (generalized): Secondary | ICD-10-CM | POA: Insufficient documentation

## 2023-12-11 NOTE — Patient Instructions (Signed)

## 2024-01-08 NOTE — Therapy (Incomplete)
 OUTPATIENT PHYSICAL THERAPY CERVICAL EVALUATION   Patient Name: Darlene Bowen MRN: [id number] DOB:04-22-1976, 48 y.o., female Today's Date: 01/08/2024  END OF SESSION:   Past Medical History:  Diagnosis Date   Glaucoma    IIH (idiopathic intracranial hypertension)    Multiple sclerosis (HCC)    Restless legs    No past surgical history on file. Patient Active Problem List   Diagnosis Date Noted   Eczema 08/07/2021   Class 3 severe obesity due to excess calories with body mass index (BMI) of 40.0 to 44.9 in adult 08/07/2021   Recurrent genital HSV (herpes simplex virus) infection 08/07/2021   Left flank pain 12/26/2020   Dysphagia 12/26/2020   Low iron 09/05/2020   DVT (deep venous thrombosis) (HCC) 02/15/2020   H/O Bell's palsy 02/15/2020   Vitamin B1 deficiency 02/15/2020   Foot pain 01/05/2018   Tailor's bunion 01/05/2018   Tobacco user 01/05/2018    PCP: Joenathan Muslim, PA   REFERRING PROVIDER: Joenathan Muslim, PA   REFERRING DIAG:  M50.30 (ICD-10-CM) - Other cervical disc degeneration, unspecified cervical region  M51.34 (ICD-10-CM) - Other intervertebral disc degeneration, thoracic region    THERAPY DIAG:  No diagnosis found.  Rationale for Evaluation and Treatment: Rehabilitation  ONSET DATE: ***  SUBJECTIVE:                                                                                                                                                                                                         SUBJECTIVE STATEMENT: *** Hand dominance: {MISC; OT HAND [phone]}  PERTINENT HISTORY:  H/o Bell's palsy, HTN, Multiple Sclerosis, restless legs, obesity  PAIN:  Are you having pain? Yes: NPRS scale: *** Pain location: *** Pain description: *** Aggravating factors: *** Relieving factors: ***  PRECAUTIONS: {Therapy precautions:24002}  RED FLAGS: {PT Red Flags:29287}     WEIGHT BEARING RESTRICTIONS: {Yes  ***/No:24003}  FALLS:  Has patient fallen in last 6 months? {fallsyesno:27318}  LIVING ENVIRONMENT: Lives with: {OPRC lives with:25569::"lives with their family"} Lives in: {Lives in:25570} Stairs: {opstairs:27293} Has following equipment at home: {Assistive devices:23999}  OCCUPATION: ***  PLOF: {PLOF:24004}  PATIENT GOALS: ***  NEXT MD VISIT: ***  OBJECTIVE:  Note: Objective measures were completed at Evaluation unless otherwise noted.  DIAGNOSTIC FINDINGS:  ***  PATIENT SURVEYS:  {rehab surveys:24030}  COGNITION: Overall cognitive status: {cognition:24006}  SENSATION: {sensation:27233}  POSTURE: {posture:25561}  PALPATION: ***   CERVICAL ROM:   {AROM/PROM:27142} ROM A/PROM (deg) eval  Flexion   Extension   Right lateral flexion   Left lateral flexion  Right rotation   Left rotation    (Blank rows = not tested)  UPPER EXTREMITY ROM:  {AROM/PROM:27142} ROM Right eval Left eval  Shoulder flexion    Shoulder extension    Shoulder abduction    Shoulder adduction    Shoulder extension    Shoulder internal rotation    Shoulder external rotation    Elbow flexion    Elbow extension    Wrist flexion    Wrist extension    Wrist ulnar deviation    Wrist radial deviation    Wrist pronation    Wrist supination     (Blank rows = not tested)  UPPER EXTREMITY MMT:  MMT Right eval Left eval  Shoulder flexion    Shoulder extension    Shoulder abduction    Shoulder adduction    Shoulder extension    Shoulder internal rotation    Shoulder external rotation    Middle trapezius    Lower trapezius    Elbow flexion    Elbow extension    Wrist flexion    Wrist extension    Wrist ulnar deviation    Wrist radial deviation    Wrist pronation    Wrist supination    Grip strength     (Blank rows = not tested)  CERVICAL SPECIAL TESTS:  {Cervical special tests:25246}  FUNCTIONAL TESTS:  {Functional tests:24029}  TREATMENT DATE: ***                                                                                                                                  PATIENT EDUCATION:  Education details: *** Person educated: {Person educated:25204} Education method: {Education Method:25205} Education comprehension: {Education Comprehension:25206}  HOME EXERCISE PROGRAM: ***  ASSESSMENT:  CLINICAL IMPRESSION: Patient is a *** y.o. *** who was seen today for physical therapy evaluation and treatment for ***.   OBJECTIVE IMPAIRMENTS: {opptimpairments:25111}.   ACTIVITY LIMITATIONS: {activitylimitations:27494}  PARTICIPATION LIMITATIONS: {participationrestrictions:25113}  PERSONAL FACTORS: {Personal factors:25162} are also affecting patient's functional outcome.   REHAB POTENTIAL: {rehabpotential:25112}  CLINICAL DECISION MAKING: {clinical decision making:25114}  EVALUATION COMPLEXITY: {Evaluation complexity:25115}   GOALS: Goals reviewed with patient? {yes/no:20286}  SHORT TERM GOALS: Target date: ***  *** Baseline:  Goal status: INITIAL  2.  *** Baseline:  Goal status: INITIAL  3.  *** Baseline:  Goal status: INITIAL  4.  *** Baseline:  Goal status: INITIAL  5.  *** Baseline:  Goal status: INITIAL  6.  *** Baseline:  Goal status: INITIAL  LONG TERM GOALS: Target date: ***  *** Baseline:  Goal status: INITIAL  2.  *** Baseline:  Goal status: INITIAL  3.  *** Baseline:  Goal status: INITIAL  4.  *** Baseline:  Goal status: INITIAL  5.  *** Baseline:  Goal status: INITIAL  6.  *** Baseline:  Goal status: INITIAL   PLAN:  PT FREQUENCY: {rehab frequency:25116}  PT DURATION: {rehab duration:25117}  PLANNED INTERVENTIONS: {rehab  planned interventions:25118::"97110-Therapeutic exercises","97530- Therapeutic [phone]- Neuromuscular re-education","97535- Self WGNF","62130- Manual therapy"}  PLAN FOR NEXT SESSION: ***   ***

## 2024-01-13 ENCOUNTER — Ambulatory Visit: Admitting: Physical Therapy

## 2024-02-25 ENCOUNTER — Encounter: Payer: Self-pay | Admitting: "Endocrinology

## 2024-02-25 ENCOUNTER — Ambulatory Visit: Admitting: "Endocrinology

## 2024-02-25 VITALS — BP 120/80 | HR 80 | Ht 64.0 in | Wt 245.0 lb

## 2024-02-25 DIAGNOSIS — F1290 Cannabis use, unspecified, uncomplicated: Secondary | ICD-10-CM | POA: Diagnosis not present

## 2024-02-25 DIAGNOSIS — E042 Nontoxic multinodular goiter: Secondary | ICD-10-CM

## 2024-02-25 NOTE — Progress Notes (Signed)
 Outpatient Endocrinology Note Darlene Birmingham, MD  02/25/24   Darlene Bowen 1946/09/26 [id number]  Referring Provider: Lanell Earleen HERO, PA Primary Care Provider: Lanell Earleen HERO, PA Subjective  No chief complaint on file.   Assessment & Plan  Diagnoses and all orders for this visit:  Multinodular goiter -     TSH -     T4, free  Marijuana user  Never been on any thyroid  medication TSH WNL in past, ordered repeat lab 08/2023 THYROID  ULTRASOUND reported multinodular goiter  S/p FNA #1 Clinical History: Nodule #1: 2.6 cm  Specimen Submitted:  A. THYROID , RIGHT LOBE, FINE NEEDLE ASPIRATION - Benign follicular nodule (Bethesda category II)  FNA #2 Clinical History: Nodule #3: 2.8 cm  Specimen Submitted:  B. THYROID , RIGHT LOBE, FINE NEEDLE ASPIRATION - Benign follicular nodule (Bethesda category II)  Confirmed and discussed US  report with radiologist F/u thyroid  U/S next year   Uses marijuana at night to help sleep Chronic user since 14 Had long discussion on pros and cons, patient is not interested in quitting   I have reviewed current medications, nurse's notes, allergies, vital signs, past medical and surgical history, family medical history, and social history for this encounter. Counseled patient on symptoms, examination findings, lab findings, imaging results, treatment decisions and monitoring and prognosis. The patient understood the recommendations and agrees with the treatment plan. All questions regarding treatment plan were fully answered.   Return in about 8 months (around 10/27/2024) for visit, labs today.   Darlene Birmingham, MD  02/25/24   I have reviewed current medications, nurse's notes, allergies, vital signs, past medical and surgical history, family medical history, and social history for this encounter. Counseled patient on symptoms, examination findings, lab findings, imaging results, treatment decisions and monitoring and prognosis. The patient  understood the recommendations and agrees with the treatment plan. All questions regarding treatment plan were fully answered.   History of Present Illness Darlene Bowen is a 48 y.o. year old female who presents to our clinic with thyroid  nodule diagnosed around 2022.    Never been on any thyroid  medication  Diagnosed of MS after 6 years of various symptoms and going to [facility].   Compressive symptoms:  dysphagia  Yes, in and out, s/p esophageal dilation, complaint return after 2 months of the procedure  dysphonia  No positional dyspnea (especially with simultaneous arms elevation)  No  Smokes  Yes, smokes marijuana  On biotin  No Personal history of head/neck surgery/irradiation  No  08/08/23 THYROID  ULTRASOUND   TECHNIQUE: Ultrasound examination of the thyroid  gland and adjacent soft tissues was performed.   COMPARISON:  None Available.   FINDINGS: Parenchymal Echotexture: Mildly heterogenous   Isthmus: 0.4 cm   Right lobe: 6.3 x 1.7 x 2.1 cm   Left lobe: 6.0 x 2.0 x 2.8 cm   _________________________________________________________   Estimated total number of nodules >/= 1 cm: 4   Number of spongiform nodules >/=  2 cm not described below (TR1): 0   Number of mixed cystic and solid nodules >/= 1.5 cm not described below (TR2): 0   _________________________________________________________   Enlarged, heterogeneous and multinodular thyroid  gland. There are numerous small cysts and nodules which are not described in detail. Larger lesions are enumerated below.   Nodule # 1: Isoechoic solid nodule in the right lower gland measures 2.6 x 2.0 x 1.4 cm. No calcifications or suspicious features. Findings are consistent with TI-RADS category 3. **Given size (>/= 2.5  cm) and appearance, fine needle aspiration of this mildly suspicious nodule should be considered based on TI-RADS criteria.   Nodule # 3: Predominantly solid hypoechoic nodule in the right  upper gland measures 2.8 x 1.5 x 2.4 cm. There are areas of internal cystic degeneration. No calcifications or suspicious features. Findings are consistent with TI-RADS category 4. **Given size (>/= 1.5 cm) and appearance, fine needle aspiration of this moderately suspicious nodule should be considered based on TI-RADS criteria.   Nodule # 5, 6 and 7: Vague regions of pseudo nodularity measured on the sonographer worksheet appear to represent a cluster of smaller nodules. When measured individually, no individual lesion meets size criteria to warrant evaluation. Given size (<1.4 cm) and appearance, this nodule does NOT meet TI-RADS criteria for biopsy or dedicated follow-up.   IMPRESSION: 1. One enlarged, heterogeneous and multinodular thyroid  gland most consistent multinodular goiter. 2. Nodule # 1 is a 2.6 cm TI-RADS category 3 nodule in the right inferior gland meets criteria to consider fine-needle aspiration biopsy. 3. Nodule # 3 is a 2.8 cm TI-RADS category 4 nodule in the right upper gland and meets criteria to consider fine-needle aspiration biopsy. 4. Region of vague pseudo nodularity dominates the left mid gland. However, no individual lesion meets criteria to warrant biopsy or dedicated imaging surveillance. 5. Numerous additional small cysts and nodules are present but do not require further evaluation.  ______________  FNA #1 Clinical History: Nodule #1 2.6 cm  Specimen Submitted:  A. THYROID , RIGHT LOBE, FINE NEEDLE ASPIRATION - Benign follicular nodule (Bethesda category II   FNA #2 Clinical History: Nodule #3 2.8 cm  Specimen Submitted:  B. THYROID , RIGHT LOBE, FINE NEEDLE ASPIRATION - Benign follicular nodule (Bethesda category II)    Physical Exam  BP 120/80   Pulse 80   Ht 5' 4 (1.626 m)   Wt 245 lb (111.1 kg)   SpO2 99%   BMI 42.05 kg/m  Constitutional: well developed, well nourished Head: normocephalic, atraumatic, no exophthalmos Eyes: sclera  anicteric, no redness Neck: + thyromegaly, no thyroid  tenderness; + nodules palpated Lungs: normal respiratory effort Neurology: alert and oriented, no fine hand tremor Skin: dry, no appreciable rashes Musculoskeletal: no appreciable defects Psychiatric: normal mood and affect  Allergies Allergies  Allergen Reactions   Codeine Anaphylaxis   Macrobid [Nitrofurantoin] Anaphylaxis   Penicillins Swelling    Current Medications Patient's Medications  New Prescriptions   No medications on file  Previous Medications   ALCLOMETHASONE (ACLOVATE ) 0.05 % OINTMENT    Apply topically 2 (two) times daily.   AMITRIPTYLINE HCL PO    Take by mouth.   CRISABOROLE  (EUCRISA ) 2 % OINT    Apply 1 application topically daily as needed.   MELOXICAM  (MOBIC ) 15 MG TABLET    Take 1 tablet (15 mg total) by mouth daily.   MIRABEGRON  ER (MYRBETRIQ ) 25 MG TB24 TABLET    Take 1 tablet (25 mg total) by mouth daily.   PREGABALIN (LYRICA) 50 MG CAPSULE    Take 50 mg by mouth. Take 50mg  daily in the morning and 100mg  at lunch and at bedtime   VALACYCLOVIR  (VALTREX ) 1000 MG TABLET    TAKE 1 TABLET(1000 MG) BY MOUTH DAILY  Modified Medications   No medications on file  Discontinued Medications   No medications on file    Past Medical History Past Medical History:  Diagnosis Date   Glaucoma    IIH (idiopathic intracranial hypertension)    Multiple sclerosis (HCC)  Restless legs     Past Surgical History History reviewed. No pertinent surgical history.  Family History family history is not on file.  Social History Social History   Socioeconomic History   Marital status: Single    Spouse name: Not on file   Number of children: Not on file   Years of education: Not on file   Highest education level: Not on file  Occupational History   Not on file  Tobacco Use   Smoking status: Former    Current packs/day: 0.00    Types: Cigarettes    Quit date: 09/2019    Years since quitting: 4.4    Smokeless tobacco: Never  Vaping Use   Vaping status: Never Used  Substance and Sexual Activity   Alcohol use: Yes    Comment: socially   Drug use: Yes    Types: Marijuana   Sexual activity: Not Currently  Other Topics Concern   Not on file  Social History Narrative   Not on file   Social Drivers of Health   Financial Resource Strain: Not on file  Food Insecurity: Low Risk  (02/03/2023)   Received from [facility]   Hunger Vital Sign    Within the past 12 months, you worried that your food would run out before you got money to buy more: Never true    Within the past 12 months, the food you bought just didn't last and you didn't have money to get more. : Never true  Transportation Needs: No Transportation Needs (02/03/2023)   Received from [facility]    In the past 12 months, has lack of reliable transportation kept you from medical appointments, meetings, work or from getting things needed for daily living? : No  Physical Activity: Not on file  Stress: Not on file  Social Connections: Not on file  Intimate Partner Violence: Not on file    Laboratory Investigations No results found for: TSH, FREET4   No results found for: TSI   No components found for: TRAB   No results found for: CHOL No results found for: HDL No results found for: LDLCALC No results found for: TRIG No results found for: [facility] Lab Results  Component Value Date   CREATININE 0.85 02/27/2021   No results found for: GFR    Component Value Date/Time   NA 138 02/27/2021 1004   K 4.3 02/27/2021 1004   CL 101 02/27/2021 1004   CO2 25 02/27/2021 1004   GLUCOSE 90 02/27/2021 1004   BUN 8 02/27/2021 1004   CREATININE 0.85 02/27/2021 1004   CALCIUM 8.9 02/27/2021 1004   PROT 6.6 02/27/2021 1004   ALBUMIN 3.8 02/27/2021 1004   AST 22 02/27/2021 1004   ALT 21 02/27/2021 1004   ALKPHOS 53 02/27/2021 1004   BILITOT 0.9 02/27/2021 1004   GFRNONAA >60 02/27/2021 1004       Latest Ref Rng & Units 02/27/2021   10:04 AM 12/09/2020   12:43 PM  BMP  Glucose 70 - 99 mg/dL 90  89   BUN 6 - 20 mg/dL 8  8   Creatinine 9.55 - 1.00 mg/dL 9.14  9.10   Sodium 864 - 145 mmol/L 138  137   Potassium 3.5 - 5.1 mmol/L 4.3  3.8   Chloride 98 - 111 mmol/L 101  104   CO2 22 - 32 mmol/L 25  31   Calcium 8.9 - 10.3 mg/dL 8.9  8.8  Component Value Date/Time   WBC 4.4 02/27/2021 1004   RBC 4.49 02/27/2021 1004   HGB 13.5 02/27/2021 1004   HCT 37.6 02/27/2021 1004   PLT 231 02/27/2021 1004   MCV 83.7 02/27/2021 1004   MCH 30.1 02/27/2021 1004   MCHC 35.9 02/27/2021 1004   RDW 13.9 02/27/2021 1004      Parts of this note may have been dictated using voice recognition software. There may be variances in spelling and vocabulary which are unintentional. Not all errors are proofread. Please notify the dino if any discrepancies are noted or if the meaning of any statement is not clear.

## 2024-02-26 LAB — T4, FREE: Free T4: 0.9 ng/dL (ref 0.8–1.8)

## 2024-02-26 LAB — TSH: TSH: 2.04 m[IU]/L

## 2024-03-24 ENCOUNTER — Other Ambulatory Visit: Payer: Self-pay | Admitting: Medical Genetics

## 2024-04-01 ENCOUNTER — Telehealth: Payer: Self-pay

## 2024-04-01 NOTE — Telephone Encounter (Signed)
 complete

## 2024-04-26 ENCOUNTER — Other Ambulatory Visit: Payer: Self-pay

## 2024-04-26 ENCOUNTER — Encounter (HOSPITAL_COMMUNITY): Payer: Self-pay | Admitting: *Deleted

## 2024-04-26 ENCOUNTER — Ambulatory Visit (INDEPENDENT_AMBULATORY_CARE_PROVIDER_SITE_OTHER)

## 2024-04-26 ENCOUNTER — Ambulatory Visit (HOSPITAL_COMMUNITY)
Admission: EM | Admit: 2024-04-26 | Discharge: 2024-04-26 | Disposition: A | Discharge status: Home / Self Care | Attending: Family Medicine | Admitting: Family Medicine

## 2024-04-26 DIAGNOSIS — J189 Pneumonia, unspecified organism: Secondary | ICD-10-CM | POA: Diagnosis not present

## 2024-04-26 DIAGNOSIS — R051 Acute cough: Secondary | ICD-10-CM | POA: Diagnosis not present

## 2024-04-26 HISTORY — DX: Nontoxic multinodular goiter: E04.2

## 2024-04-26 LAB — POC SARS CORONAVIRUS 2 AG -  ED: SARS Coronavirus 2 Ag: NEGATIVE

## 2024-04-26 MED ORDER — BENZONATATE 100 MG PO CAPS: 100.0000 mg | ORAL_CAPSULE | Freq: Three times a day (TID) | ORAL | 0 refills | Status: AC | PRN

## 2024-04-26 MED ORDER — DOXYCYCLINE HYCLATE 100 MG PO CAPS: 100.0000 mg | ORAL_CAPSULE | Freq: Two times a day (BID) | ORAL | 0 refills | Status: AC

## 2024-04-26 NOTE — ED Triage Notes (Signed)
 C/O productive cough, left-sided sore throat onset 2 days ago without fevers. C/O rattling in chest. Has been taking tea with manuka honey and taking OTC meds without relief.

## 2024-04-26 NOTE — Discharge Instructions (Addendum)
 The COVID test is negative.  By my review, there is pneumonia on your chest xray. The radiologist will also read your x-ray, and if their interpretation differs significantly from mine, and the management of your condition would change, we will call you. The report will also go directly to your My Chart.  Take doxycycline  100 mg --1 capsule 2 times daily for 7 days  Take benzonatate  100 mg, 1 tab every 8 hours as needed for cough.  Make sure you are getting plenty of oral fluids in.

## 2024-04-26 NOTE — ED Provider Notes (Signed)
 [facility]    CSN: [id number] Arrival date & time: 04/26/24  1549      History   Chief Complaint Chief Complaint  Patient presents with   Cough   Sore Throat    HPI Darlene Bowen is a 48 y.o. female.    Cough Sore Throat  Here for cough and postnasal congestion and chest tightness/congestion. Symptoms began 8/20. No f/c. No nasal congestion or rhinorrhea. No n/v/d.  No h/o asthma.  Does have idiopathic intracranial hypertension and poss MS v lewy body dementia.  Allergic to pcn, macrobid, and codeine.  LMP was 8/11.  Past Medical History:  Diagnosis Date   Glaucoma    IIH (idiopathic intracranial hypertension)    Multiple sclerosis (HCC)    Multiple thyroid  nodules    Restless legs     Patient Active Problem List   Diagnosis Date Noted   Eczema 08/07/2021   Class 3 severe obesity due to excess calories with body mass index (BMI) of 40.0 to 44.9 in adult 08/07/2021   Recurrent genital HSV (herpes simplex virus) infection 08/07/2021   Left flank pain 12/26/2020   Dysphagia 12/26/2020   Low iron 09/05/2020   DVT (deep venous thrombosis) (HCC) 02/15/2020   H/O Bell's palsy 02/15/2020   Vitamin B1 deficiency 02/15/2020   Foot pain 01/05/2018   Tailor's bunion 01/05/2018   Tobacco user 01/05/2018    History reviewed. No pertinent surgical history.  OB History     Gravida  20   Para      Term      Preterm      AB      Living  4      SAB      IAB      Ectopic      Multiple      Live Births  4            Home Medications    Prior to Admission medications   Medication Sig Start Date End Date Taking? Authorizing Provider  alclomethasone (ACLOVATE ) 0.05 % ointment Apply topically 2 (two) times daily. 08/07/21  Yes Lilland, Alana, DO  AMITRIPTYLINE HCL PO Take by mouth.   Yes [provider]  BACLOFEN PO Take by mouth.   Yes [provider]  benzonatate  (TESSALON ) 100 MG capsule Take 1 capsule (100 mg  total) by mouth 3 (three) times daily as needed for cough. 04/26/24  Yes Dola Lunsford K, MD  CARBAMAZEPINE PO Take by mouth.   Yes [provider]  Crisaborole  (EUCRISA ) 2 % OINT Apply 1 application topically daily as needed. 08/07/21  Yes Lilland, Alana, DO  doxycycline  (VIBRAMYCIN ) 100 MG capsule Take 1 capsule (100 mg total) by mouth 2 (two) times daily for 7 days. 04/26/24 05/03/24 Yes Vonna Sharlet POUR, MD  mirabegron  ER (MYRBETRIQ ) 25 MG TB24 tablet Take 1 tablet (25 mg total) by mouth daily. 07/31/21  Yes Marilynne Rosaline SAILOR, MD  pregabalin (LYRICA) 50 MG capsule Take 50 mg by mouth. Take 50mg  daily in the morning and 100mg  at lunch and at bedtime   Yes [provider]  valACYclovir  (VALTREX ) 1000 MG tablet TAKE 1 TABLET(1000 MG) BY MOUTH DAILY 08/29/22  Yes Austin Ade, MD    Family History History reviewed. No pertinent family history.  Social History Social History   Tobacco Use   Smoking status: Former    Current packs/day: 0.00    Types: Cigarettes    Quit date: 09/2019  Years since quitting: 4.6   Smokeless tobacco: Never  Vaping Use   Vaping status: Never Used  Substance Use Topics   Alcohol use: Not Currently   Drug use: Yes    Types: Marijuana     Allergies   Codeine, Macrobid [nitrofurantoin], and Penicillins   Review of Systems Review of Systems  Respiratory:  Positive for cough.      Physical Exam Triage Vital Signs ED Triage Vitals  Encounter Vitals Group     BP 04/26/24 1618 120/85     Girls Systolic BP Percentile --      Girls Diastolic BP Percentile --      Boys Systolic BP Percentile --      Boys Diastolic BP Percentile --      Pulse Rate 04/26/24 1618 91     Resp 04/26/24 1618 20     Temp 04/26/24 1618 98.6 F (37 C)     Temp Source 04/26/24 1618 Oral     SpO2 04/26/24 1618 93 %     Weight --      Height --      Head Circumference --      Peak Flow --      Pain Score 04/26/24 1620 0     Pain Loc --      Pain  Education --      Exclude from Growth Chart --    No data found.  Updated Vital Signs BP 120/85   Pulse 91   Temp 98.6 F (37 C) (Oral)   Resp 20   LMP 04/12/2024 (Approximate)   SpO2 93%   Visual Acuity Right Eye Distance:   Left Eye Distance:   Bilateral Distance:    Right Eye Near:   Left Eye Near:    Bilateral Near:     Physical Exam Vitals reviewed.  Constitutional:      General: She is not in acute distress.    Appearance: She is not toxic-appearing.  HENT:     Nose: Nose normal.     Mouth/Throat:     Mouth: Mucous membranes are moist.     Comments: There is some clear exudate draining. No erythema. Eyes:     Extraocular Movements: Extraocular movements intact.     Conjunctiva/sclera: Conjunctivae normal.     Pupils: Pupils are equal, round, and reactive to light.  Cardiovascular:     Rate and Rhythm: Normal rate and regular rhythm.     Heart sounds: No murmur heard. Pulmonary:     Effort: Pulmonary effort is normal. No respiratory distress.     Breath sounds: No stridor. No wheezing, rhonchi or rales.  Musculoskeletal:     Cervical back: Neck supple.  Lymphadenopathy:     Cervical: No cervical adenopathy.  Skin:    Capillary Refill: Capillary refill takes less than 2 seconds.     Coloration: Skin is not jaundiced or pale.  Neurological:     General: No focal deficit present.     Mental Status: She is alert and oriented to person, place, and time.  Psychiatric:        Behavior: Behavior normal.      UC Treatments / Results  Labs (all labs ordered are listed, but only abnormal results are displayed) Labs Reviewed  POC SARS CORONAVIRUS 2 AG -  ED    EKG   Radiology No results found.  Procedures Procedures (including critical care time)  Medications Ordered in UC Medications - No data to display  Initial Impression / Assessment and Plan / UC Course  I have reviewed the triage vital signs and the nursing notes.  Pertinent labs &  imaging results that were available during my care of the patient were reviewed by me and considered in my medical decision making (see chart for details).     COVID test is negative.  CXR by my review shows bialteral lower lobe infiltrates. No old films to compare.  She is advised of radiology overread.  doxycycline  is sent in along with tessalon  perles for cough. Final Clinical Impressions(s) / UC Diagnoses   Final diagnoses:  Acute cough  Community acquired pneumonia, unspecified laterality     Discharge Instructions      The COVID test is negative.  By my review, there is pneumonia on your chest xray. The radiologist will also read your x-ray, and if their interpretation differs significantly from mine, and the management of your condition would change, we will call you. The report will also go directly to your My Chart.  Take doxycycline  100 mg --1 capsule 2 times daily for 7 days  Take benzonatate  100 mg, 1 tab every 8 hours as needed for cough.  Make sure you are getting plenty of oral fluids in.     ED Prescriptions     Medication Sig Dispense Auth. Provider   doxycycline  (VIBRAMYCIN ) 100 MG capsule Take 1 capsule (100 mg total) by mouth 2 (two) times daily for 7 days. 14 capsule Vivienne Sangiovanni K, MD   benzonatate  (TESSALON ) 100 MG capsule Take 1 capsule (100 mg total) by mouth 3 (three) times daily as needed for cough. 21 capsule Tamra Koos K, MD      PDMP not reviewed this encounter.   Vonna Sharlet POUR, MD 04/26/24 1726

## 2024-05-05 ENCOUNTER — Other Ambulatory Visit

## 2024-05-05 DIAGNOSIS — Z006 Encounter for examination for normal comparison and control in clinical research program: Secondary | ICD-10-CM

## 2024-05-14 LAB — GENECONNECT MOLECULAR SCREEN: Genetic Analysis Overall Interpretation: NEGATIVE

## 2024-07-05 ENCOUNTER — Encounter: Payer: Self-pay | Admitting: Radiology

## 2024-10-27 ENCOUNTER — Ambulatory Visit: Admitting: "Endocrinology
